# Patient Record
Sex: Female | Born: 1985 | Hispanic: No | Marital: Married | Smoking: Former smoker
Health system: Southern US, Community
[De-identification: ages and names within clinical notes are randomized; demographics above are authoritative.]

## PROBLEM LIST (undated history)

## (undated) DIAGNOSIS — E8881 Metabolic syndrome: Secondary | ICD-10-CM

## (undated) DIAGNOSIS — O139 Gestational [pregnancy-induced] hypertension without significant proteinuria, unspecified trimester: Secondary | ICD-10-CM

## (undated) DIAGNOSIS — N83209 Unspecified ovarian cyst, unspecified side: Secondary | ICD-10-CM

## (undated) DIAGNOSIS — E282 Polycystic ovarian syndrome: Secondary | ICD-10-CM

## (undated) DIAGNOSIS — Z789 Other specified health status: Secondary | ICD-10-CM

## (undated) DIAGNOSIS — D649 Anemia, unspecified: Secondary | ICD-10-CM

## (undated) HISTORY — PX: NO PAST SURGERIES: SHX2092

## (undated) HISTORY — DX: Gestational (pregnancy-induced) hypertension without significant proteinuria, unspecified trimester: O13.9

## (undated) HISTORY — DX: Other specified health status: Z78.9

---

## 2013-12-14 ENCOUNTER — Ambulatory Visit: Payer: BC Managed Care – PPO | Admitting: Medical

## 2017-02-19 ENCOUNTER — Ambulatory Visit (INDEPENDENT_AMBULATORY_CARE_PROVIDER_SITE_OTHER): Payer: 59 | Admitting: Obstetrics & Gynecology

## 2017-02-19 ENCOUNTER — Encounter: Payer: Self-pay | Admitting: Obstetrics & Gynecology

## 2017-02-19 VITALS — BP 123/79 | HR 79 | Ht 60.0 in | Wt 234.0 lb

## 2017-02-19 DIAGNOSIS — Z124 Encounter for screening for malignant neoplasm of cervix: Secondary | ICD-10-CM

## 2017-02-19 DIAGNOSIS — Z113 Encounter for screening for infections with a predominantly sexual mode of transmission: Secondary | ICD-10-CM | POA: Diagnosis not present

## 2017-02-19 DIAGNOSIS — Z01419 Encounter for gynecological examination (general) (routine) without abnormal findings: Secondary | ICD-10-CM

## 2017-02-19 DIAGNOSIS — N979 Female infertility, unspecified: Secondary | ICD-10-CM

## 2017-02-19 MED ORDER — PRENATAL VITAMINS 0.8 MG PO TABS: 1.0000 | ORAL_TABLET | Freq: Every day | ORAL | 12 refills | Status: AC

## 2017-02-19 NOTE — Patient Instructions (Signed)

## 2017-02-19 NOTE — Progress Notes (Signed)
Pt states she is trying to conceive x 6 months.  Pt is in homosexual relationship. Pt states she has tried IUI in the past, pt has had chemical pregnanacy. Pt states she has a small bump on vaginal area x 1 month.

## 2017-02-19 NOTE — Progress Notes (Signed)
Patient ID: Haley Banks, female   DOB: 1986/05/16, 31 y.o.   MRN: [id number]  Chief Complaint  Patient presents with  . Gynecologic Exam  Trying to conceive with semen donor  HPI Haley Banks is a 31 y.o. female.  G0P0000 Patient's last menstrual period was 02/06/2017. Regular menses. She has not conceived yet with either IUI or home methods with a donor. She is in a same sex relationship. Last pap she thinks was 2 years ago and was normal. HPI  History reviewed. No pertinent past medical history.  History reviewed. No pertinent surgical history.  Family History  Problem Relation Age of Onset  . Diabetes Paternal Grandfather     Social History Social History  Substance Use Topics  . Smoking status: Never Smoker  . Smokeless tobacco: Never Used  . Alcohol use No     Comment: occ    Not on File  No current outpatient prescriptions on file.   No current facility-administered medications for this visit.     Review of Systems Review of Systems  Constitutional: Negative.   Respiratory: Negative.   Gastrointestinal: Negative.   Genitourinary: Negative.   Neurological: Negative.     Blood pressure 123/79, pulse 79, height 5' (1.524 m), weight 234 lb (106.1 kg), last menstrual period 02/06/2017. Body mass index is 45.7 kg/m.  Physical Exam Physical Exam  Constitutional: She is oriented to person, place, and time. She appears well-developed. No distress.  obese  HENT:  Head: Normocephalic and atraumatic.  Eyes: Pupils are equal, round, and reactive to light.  Neck: Normal range of motion.  Cardiovascular: Normal rate.   Pulmonary/Chest: Effort normal. No respiratory distress.  Breasts: breasts appear normal, no suspicious masses, no skin or nipple changes or axillary nodes.   Abdominal: Soft. She exhibits no distension. There is no tenderness.  Genitourinary: Vagina normal and uterus normal. No vaginal discharge found.  Genitourinary Comments: No mass or  tenderness  Musculoskeletal: Normal range of motion.  Neurological: She is alert and oriented to person, place, and time.  Skin: Skin is warm and dry.  Psychiatric: She has a normal mood and affect. Her behavior is normal.  Vitals reviewed.   Data Reviewed Notes from Dr. Elesa Hackereaton in care everywhere  Assessment    Patient Active Problem List   Diagnosis Date Noted  . Infertility, female 02/19/2017  . Morbid obesity (HCC) 02/19/2017   Well woman exam    Plan    Will try to conceive without specialty care. She should notify us if she conceives. Take prenatal vitamin       Scheryl DarterJames Arnold 02/19/2017, 9:21 AM

## 2017-02-21 LAB — CYTOLOGY - PAP
CHLAMYDIA, DNA PROBE: NEGATIVE
DIAGNOSIS: NEGATIVE
Neisseria Gonorrhea: NEGATIVE

## 2019-06-24 LAB — OB RESULTS CONSOLE GC/CHLAMYDIA
Chlamydia: POSITIVE
Gonorrhea: NEGATIVE

## 2019-06-24 LAB — OB RESULTS CONSOLE ANTIBODY SCREEN: Antibody Screen: NEGATIVE

## 2019-06-24 LAB — OB RESULTS CONSOLE RPR: RPR: NONREACTIVE

## 2019-06-24 LAB — OB RESULTS CONSOLE RUBELLA ANTIBODY, IGM: Rubella: IMMUNE

## 2019-06-24 LAB — OB RESULTS CONSOLE HEPATITIS B SURFACE ANTIGEN: Hepatitis B Surface Ag: NEGATIVE

## 2019-06-24 LAB — OB RESULTS CONSOLE HIV ANTIBODY (ROUTINE TESTING): HIV: NONREACTIVE

## 2019-06-24 LAB — OB RESULTS CONSOLE ABO/RH: RH Type: POSITIVE

## 2019-07-20 LAB — OB RESULTS CONSOLE GC/CHLAMYDIA: Chlamydia: NEGATIVE

## 2019-08-06 NOTE — L&D Delivery Note (Signed)
Brief Delivery Note  Called to assist with shoulder dystocia on going for approximately 2 minutes. Mother found on hands and knees with fetal head delivered. Asked nurses to assist with maternal repositioning in lithotomy position. Woods scres maneuver was performed which allowed delivery of the posterior arm. Infant handed to neonatal team.   Please see rest of the delivery note by CNM   Haley Banks 01/09/2020, 1:00 PM

## 2019-08-06 NOTE — L&D Delivery Note (Signed)
Delivery Note Labor onset: 01/08/2020  Labor Onset Time:1348 Complete dilation at  01/09/20 1105 FHR second stage Cat 1 Analgesia/Anesthesia intrapartum: Epidural  Guided pushing with maternal urge. Delivery of a viable female at 9. Turtle sign noted approx 15 sec after delivery of fetal hea. Fetal head delivered in LOA position and did not restitute. Gentle axial traction applied w/o release of posterior shoulder. McRoberts' position at 30 sec, no release of shoulder. Called for assistance from [employer]. Suprapubic pressure from maternal right at 1 min, shoulder not released. Rotated to knee-chest and Dr. Jolayne Panther to bedside. Care handed to MD. See MD note for details. Nuchal cord: none. Infant with poor tone, cord double clamped and cut immediately. NICU present and received infant. Infant remained in room and transitioned from warmer to skin-to-skin. No deficit in left arm observed.  Cord blood sample collected Arterial cord blood sample attempted from cord, but artery collapsed. Collection attempted from site closer to placental base.  Placenta delivered Tomasa Blase, intact, with 3 VC.  Placenta to pathology. Uterine tone firm bleeding small  First degree perineal laceration identified.  Anesthesia: Epidural Repair: None, site hemostatic EBL (mL): 150 Complications: Shoulder dystocia 4 min 30 sec APGAR: APGAR (1 MIN):   APGAR (5 MINS):   APGAR (10 MINS):   Mom to postpartum.  Baby to Couplet care / Skin to Skin.  Roma Schanz MSN, CNM 01/09/2020, 1:21 PM

## 2019-08-20 ENCOUNTER — Other Ambulatory Visit (HOSPITAL_COMMUNITY): Payer: Self-pay | Admitting: Obstetrics & Gynecology

## 2019-08-20 DIAGNOSIS — Z3A20 20 weeks gestation of pregnancy: Secondary | ICD-10-CM

## 2019-08-20 DIAGNOSIS — Z363 Encounter for antenatal screening for malformations: Secondary | ICD-10-CM

## 2019-09-13 ENCOUNTER — Encounter (HOSPITAL_COMMUNITY): Payer: Self-pay | Admitting: *Deleted

## 2019-09-16 ENCOUNTER — Ambulatory Visit (HOSPITAL_COMMUNITY): Payer: 59 | Admitting: *Deleted

## 2019-09-16 ENCOUNTER — Encounter (HOSPITAL_COMMUNITY): Payer: Self-pay

## 2019-09-16 ENCOUNTER — Ambulatory Visit (HOSPITAL_COMMUNITY)
Admission: RE | Admit: 2019-09-16 | Discharge: 2019-09-16 | Disposition: A | Discharge status: Home / Self Care | Payer: 59 | Source: Ambulatory Visit | Attending: Obstetrics & Gynecology | Admitting: Obstetrics & Gynecology

## 2019-09-16 ENCOUNTER — Other Ambulatory Visit (HOSPITAL_COMMUNITY): Payer: Self-pay | Admitting: *Deleted

## 2019-09-16 ENCOUNTER — Other Ambulatory Visit: Payer: Self-pay

## 2019-09-16 VITALS — BP 126/81 | HR 89 | Temp 97.9°F | Wt 250.0 lb

## 2019-09-16 DIAGNOSIS — Z363 Encounter for antenatal screening for malformations: Secondary | ICD-10-CM | POA: Diagnosis present

## 2019-09-16 DIAGNOSIS — O0990 Supervision of high risk pregnancy, unspecified, unspecified trimester: Secondary | ICD-10-CM | POA: Insufficient documentation

## 2019-09-16 DIAGNOSIS — Z3A20 20 weeks gestation of pregnancy: Secondary | ICD-10-CM | POA: Insufficient documentation

## 2019-09-16 DIAGNOSIS — O99212 Obesity complicating pregnancy, second trimester: Secondary | ICD-10-CM | POA: Diagnosis not present

## 2019-09-16 DIAGNOSIS — Z362 Encounter for other antenatal screening follow-up: Secondary | ICD-10-CM

## 2019-09-16 NOTE — Progress Notes (Signed)
Pt reports small amount of clear watery fluid in her panties off and on this week. Reports +FM, denies pain or bleeding.

## 2019-09-28 ENCOUNTER — Encounter (HOSPITAL_COMMUNITY): Payer: Self-pay | Admitting: Obstetrics and Gynecology

## 2019-10-13 ENCOUNTER — Encounter (HOSPITAL_COMMUNITY): Payer: Self-pay

## 2019-10-13 ENCOUNTER — Ambulatory Visit (HOSPITAL_COMMUNITY)
Admission: RE | Admit: 2019-10-13 | Discharge: 2019-10-13 | Disposition: A | Discharge status: Home / Self Care | Payer: 59 | Source: Ambulatory Visit | Attending: Obstetrics and Gynecology | Admitting: Obstetrics and Gynecology

## 2019-10-13 ENCOUNTER — Other Ambulatory Visit: Payer: Self-pay

## 2019-10-13 ENCOUNTER — Ambulatory Visit (HOSPITAL_COMMUNITY): Payer: 59 | Admitting: *Deleted

## 2019-10-13 VITALS — BP 124/81 | HR 96 | Temp 97.5°F

## 2019-10-13 DIAGNOSIS — Z315 Encounter for genetic counseling: Secondary | ICD-10-CM

## 2019-10-13 DIAGNOSIS — Z362 Encounter for other antenatal screening follow-up: Secondary | ICD-10-CM

## 2019-10-13 DIAGNOSIS — O99212 Obesity complicating pregnancy, second trimester: Secondary | ICD-10-CM

## 2019-10-13 DIAGNOSIS — Z363 Encounter for antenatal screening for malformations: Secondary | ICD-10-CM

## 2019-10-13 DIAGNOSIS — Z3A24 24 weeks gestation of pregnancy: Secondary | ICD-10-CM

## 2019-12-29 ENCOUNTER — Other Ambulatory Visit: Payer: Self-pay | Admitting: Obstetrics & Gynecology

## 2019-12-30 ENCOUNTER — Observation Stay (HOSPITAL_BASED_OUTPATIENT_CLINIC_OR_DEPARTMENT_OTHER): Payer: 59

## 2019-12-30 ENCOUNTER — Observation Stay (HOSPITAL_COMMUNITY)
Admission: AD | Admit: 2019-12-30 | Discharge: 2019-12-31 | Disposition: A | Discharge status: Home / Self Care | Payer: 59 | Attending: Obstetrics & Gynecology | Admitting: Obstetrics & Gynecology

## 2019-12-30 ENCOUNTER — Other Ambulatory Visit: Payer: Self-pay

## 2019-12-30 ENCOUNTER — Encounter (HOSPITAL_COMMUNITY): Payer: Self-pay | Admitting: Obstetrics and Gynecology

## 2019-12-30 DIAGNOSIS — Z3A36 36 weeks gestation of pregnancy: Secondary | ICD-10-CM | POA: Insufficient documentation

## 2019-12-30 DIAGNOSIS — Z20822 Contact with and (suspected) exposure to covid-19: Secondary | ICD-10-CM | POA: Insufficient documentation

## 2019-12-30 DIAGNOSIS — O139 Gestational [pregnancy-induced] hypertension without significant proteinuria, unspecified trimester: Principal | ICD-10-CM | POA: Diagnosis present

## 2019-12-30 DIAGNOSIS — Z362 Encounter for other antenatal screening follow-up: Secondary | ICD-10-CM

## 2019-12-30 DIAGNOSIS — Z3A35 35 weeks gestation of pregnancy: Secondary | ICD-10-CM

## 2019-12-30 DIAGNOSIS — R030 Elevated blood-pressure reading, without diagnosis of hypertension: Secondary | ICD-10-CM | POA: Diagnosis not present

## 2019-12-30 DIAGNOSIS — Z3689 Encounter for other specified antenatal screening: Secondary | ICD-10-CM

## 2019-12-30 DIAGNOSIS — O26893 Other specified pregnancy related conditions, third trimester: Secondary | ICD-10-CM

## 2019-12-30 HISTORY — DX: Anemia, unspecified: D64.9

## 2019-12-30 HISTORY — DX: Polycystic ovarian syndrome: E28.2

## 2019-12-30 HISTORY — DX: Unspecified ovarian cyst, unspecified side: N83.209

## 2019-12-30 HISTORY — DX: Metabolic syndrome: E88.81

## 2019-12-30 LAB — PROTEIN / CREATININE RATIO, URINE
Creatinine, Urine: 33.9 mg/dL
Protein Creatinine Ratio: 0.24 mg/mg{Cre} — ABNORMAL HIGH (ref 0.00–0.15)
Total Protein, Urine: 8 mg/dL

## 2019-12-30 LAB — URINALYSIS, COMPLETE (UACMP) WITH MICROSCOPIC
Bacteria, UA: NONE SEEN
Bilirubin Urine: NEGATIVE
Glucose, UA: >=500 mg/dL — AB
Ketones, ur: 5 mg/dL — AB
Nitrite: NEGATIVE
Protein, ur: NEGATIVE mg/dL
Specific Gravity, Urine: 1.012 (ref 1.005–1.030)
pH: 7 (ref 5.0–8.0)

## 2019-12-30 LAB — CBC
HCT: 40.3 % (ref 36.0–46.0)
Hemoglobin: 13.6 g/dL (ref 12.0–15.0)
MCH: 29.2 pg (ref 26.0–34.0)
MCHC: 33.7 g/dL (ref 30.0–36.0)
MCV: 86.7 fL (ref 80.0–100.0)
Platelets: 205 10*3/uL (ref 150–400)
RBC: 4.65 MIL/uL (ref 3.87–5.11)
RDW: 13.1 % (ref 11.5–15.5)
WBC: 11.7 10*3/uL — ABNORMAL HIGH (ref 4.0–10.5)
nRBC: 0 % (ref 0.0–0.2)

## 2019-12-30 LAB — COMPREHENSIVE METABOLIC PANEL
ALT: 29 U/L (ref 0–44)
AST: 19 U/L (ref 15–41)
Albumin: 2.7 g/dL — ABNORMAL LOW (ref 3.5–5.0)
Alkaline Phosphatase: 97 U/L (ref 38–126)
Anion gap: 9 (ref 5–15)
BUN: 5 mg/dL — ABNORMAL LOW (ref 6–20)
CO2: 23 mmol/L (ref 22–32)
Calcium: 9.4 mg/dL (ref 8.9–10.3)
Chloride: 106 mmol/L (ref 98–111)
Creatinine, Ser: 0.49 mg/dL (ref 0.44–1.00)
GFR calc Af Amer: >60 mL/min (ref >60)
GFR calc non Af Amer: >60 mL/min (ref >60)
Glucose, Bld: 123 mg/dL — ABNORMAL HIGH (ref 70–99)
Potassium: 3.9 mmol/L (ref 3.5–5.1)
Sodium: 138 mmol/L (ref 135–145)
Total Bilirubin: 0.1 mg/dL — ABNORMAL LOW (ref 0.3–1.2)
Total Protein: 6.4 g/dL — ABNORMAL LOW (ref 6.5–8.1)

## 2019-12-30 LAB — GROUP B STREP BY PCR: Group B strep by PCR: NEGATIVE

## 2019-12-30 LAB — TYPE AND SCREEN
ABO/RH(D): A POS
Antibody Screen: NEGATIVE

## 2019-12-30 LAB — SARS CORONAVIRUS 2 (TAT 6-24 HRS): SARS Coronavirus 2: NEGATIVE

## 2019-12-30 LAB — ABO/RH: ABO/RH(D): A POS

## 2019-12-30 MED ORDER — LABETALOL HCL 5 MG/ML IV SOLN: 40.0000 mg | INTRAVENOUS | Status: DC | PRN

## 2019-12-30 MED ORDER — BETAMETHASONE SOD PHOS & ACET 6 (3-3) MG/ML IJ SUSP
12.0000 mg | INTRAMUSCULAR | Status: AC
  Administered 2019-12-30 – 2019-12-31 (×2): 12 mg via INTRAMUSCULAR
  Filled 2019-12-30: qty 5

## 2019-12-30 MED ORDER — PRENATAL MULTIVITAMIN CH
1.0000 | ORAL_TABLET | Freq: Every day | ORAL | Status: DC
  Filled 2019-12-30: qty 1

## 2019-12-30 MED ORDER — DOCUSATE SODIUM 100 MG PO CAPS
100.0000 mg | ORAL_CAPSULE | Freq: Every day | ORAL | Status: DC
  Filled 2019-12-30: qty 1

## 2019-12-30 MED ORDER — ZOLPIDEM TARTRATE 5 MG PO TABS: 5.0000 mg | ORAL_TABLET | Freq: Every evening | ORAL | Status: DC | PRN

## 2019-12-30 MED ORDER — LABETALOL HCL 5 MG/ML IV SOLN: 20.0000 mg | INTRAVENOUS | Status: DC | PRN

## 2019-12-30 MED ORDER — LABETALOL HCL 5 MG/ML IV SOLN: 80.0000 mg | INTRAVENOUS | Status: DC | PRN

## 2019-12-30 MED ORDER — HYDRALAZINE HCL 20 MG/ML IJ SOLN: 10.0000 mg | INTRAMUSCULAR | Status: DC | PRN

## 2019-12-30 MED ORDER — SODIUM CHLORIDE 0.9 % IV SOLN: 250.0000 mL | INTRAVENOUS | Status: DC | PRN

## 2019-12-30 MED ORDER — SODIUM CHLORIDE 0.9% FLUSH: 3.0000 mL | INTRAVENOUS | Status: DC | PRN

## 2019-12-30 MED ORDER — LACTATED RINGERS IV SOLN
INTRAVENOUS | Status: DC
  Administered 2019-12-30: 125 mL/h via INTRAVENOUS

## 2019-12-30 MED ORDER — ACETAMINOPHEN 325 MG PO TABS: 650.0000 mg | ORAL_TABLET | ORAL | Status: DC | PRN

## 2019-12-30 MED ORDER — CALCIUM CARBONATE ANTACID 500 MG PO CHEW: 2.0000 | CHEWABLE_TABLET | ORAL | Status: DC | PRN

## 2019-12-30 MED ORDER — SODIUM CHLORIDE 0.9% FLUSH: 3.0000 mL | Freq: Two times a day (BID) | INTRAVENOUS | Status: DC

## 2019-12-30 NOTE — H&P (Signed)
OB ADMISSION/ HISTORY & PHYSICAL:  Admission Date: 12/30/2019 11:12 AM  Admit Diagnosis: Gestational hypertension [O13.9]    Haley Banks is a 34 y.o. female presenting for elevated blood pressures at the office. Pt states she was seen today at [facility] and stated she had a headache and had elevated BP. Pt was sent to MAU for a preeclampsia work-up. CBC and CMP was WNL with a PCR 0.24. Pt states her headache has resolved. Pt had one sever range BP and several elevated BPs and she was admitted for observation. Denies epigastric pain or visual changes. Denies vaginal bleeding or leaking of fluid. States she feels period-like cramping intermittently. Endorses + fetal movement. Partner at the bedside providing support. Anticipating a baby boy.   Prenatal History: G1P0000   EDC : 01/28/2020, by Last Menstrual Period  Prenatal care at [facility] since 13 weeks   Prenatal course complicated by: 1. Chlamydial infection - Positive at MP visit, treated. TOC negative, recheck at 36 weeks. 2. Maternal obesity complicating pregnancy, childbirth and the puerperium, antepartum - Follow growth, antenatal testing from 32 weeks, delivery by 40 weeks. MFM recommend efw q4wks until delivery 3. History of infertility - female  4. Polycystic ovary syndrome  Prenatal Labs: ABO, Rh:   A POS Antibody:  Neg Rubella:   Immune RPR:   Non-reactive HBsAg:   Negative HIV:   Negative GBS:   ??? 1 hr Glucola : 183, 3 hr GTT WNL Genetic Screening: Low risk female Ultrasound: 12/16/2019 Nelda [place]se[place]. Vertex. Posterior Placenta. Cervix not seen per Protocol. AMniotic fluid appears Normal AFI=16.8 cm BPP after NST for fetal Tachycardia BPP 8/8 in 5 minutes Bilateral Adnexas appear unremarkable    Maternal Diabetes: No Genetic Screening: Normal Maternal Ultrasounds/Referrals: Normal Fetal Ultrasounds or other Referrals:  None Maternal Substance Abuse:  No Significant Maternal Medications:  None Significant Maternal Lab Results:   Other: Chlamydia + at NOB, treated Other Comments:  None  Medical / Surgical History :  Past medical history:  Past Medical History:  Diagnosis Date  . Anemia   . Insulin resistance    linked w/PCOS  . Medical history non-contributory   . Ovarian cyst   . PCOS (polycystic ovarian syndrome)      Past surgical history:  Past Surgical History:  Procedure Laterality Date  . NO PAST SURGERIES       Family History:  Family History  Problem Relation Age of Onset  . Diabetes Paternal Grandfather   . Asthma Mother   . Alopecia Mother   . Diabetes Father   . Hypertension Father      Social History:  reports that she has been smoking e-cigarettes. She has never used smokeless tobacco. She reports previous alcohol use. She reports that she does not use drugs.   Allergies: Patient has no known allergies.   Current Medications at time of admission:  Medications Prior to Admission  Medication Sig Dispense Refill Last Dose  . Fluticasone Propionate (FLONASE NA) Place into the nose.   12/30/2019 at Unknown time  . Levocetirizine Dihydrochloride (XYZAL PO) Take by mouth.   12/30/2019 at Unknown time  . Prenatal Multivit-Min-Fe-FA (PRENATAL VITAMINS) 0.8 MG tablet Take 1 tablet by mouth daily. 30 tablet 12 12/29/2019 at Unknown time  . Pyridoxine HCl (VITAMIN B6 PO) Take by mouth.   12/30/2019 at Unknown time    Review of Systems: Review of Systems  Constitutional: Negative for chills and fever.  HENT: Negative for congestion and sore throat.   Eyes:  Negative for blurred vision and photophobia.  Respiratory: Negative for cough and shortness of breath.   Cardiovascular: Negative for chest pain and leg swelling.  Gastrointestinal: Negative for heartburn, nausea and vomiting.  Musculoskeletal: Negative for back pain.  Neurological: Positive for headaches. Negative for dizziness.  Psychiatric/Behavioral: Negative for depression. The patient is not nervous/anxious.    Physical  Exam: Vital signs and nursing notes reviewed.  ED Triage Vitals  Enc Vitals Group     BP 12/30/19 1130 (!) 155/110     Pulse Rate 12/30/19 1130 (!) 116     Resp 12/30/19 1130 18     Temp 12/30/19 1130 99.2 F (37.3 C)     Temp Source 12/30/19 1130 Oral     SpO2 12/30/19 1130 99 %     Weight 12/30/19 1130 268 lb 9.6 oz (121.8 kg)     Height 12/30/19 1130 5' (1.524 m)     Head Circumference --      Peak Flow --      Pain Score 12/30/19 1128 5     Pain Loc --      Pain Edu? --      Excl. in GC? --     General: AAO x 3, NAD Heart: RRR Lungs:CTAB Abdomen: Gravid, NT Extremities: no edema Genitalia / VE: Dilation: Closed Exam by:: N. Nugent, NP   FHR: 140BPM, mod variability, + accels, no decels TOCO: Ctx q 2 minutes  Labs:   Pending T&S, CBC, RPR  Recent Labs    12/30/19 1150  WBC 11.7*  HGB 13.6  HCT 40.3  PLT 205   Assessment:  34 y.o. G1P0000 at [redacted]w[redacted]d here for gestational hypertension  1. Gestational hypertension; diagnosed today 2. Category 1 FHT  Plan:  1. Admit to antepartum 2. Continuous FHR monitoring and Toco 3. BMZ series 4. Regular diet 5. Repeat labs in AM 6. Consult MFM for U/S and recommendations 7. Send urine for culture 8. GBS culture  Dr. Normand Sloop notified of admission / plan of care  June Leap CNM, MSN 12/30/2019, 5:39 PM

## 2019-12-30 NOTE — MAU Note (Addendum)
Went in to [employer] for NST, was contracting and BP was elevated, this was a new problem.  +HA (hasn't taken anything), ? 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

## 2019-12-30 NOTE — MAU Provider Note (Signed)
History     CSN: [id number]  Arrival date and time: 12/30/19 1112   First Provider Initiated Contact with Patient 12/30/19 1424      Chief Complaint  Patient presents with  . Headache  . Hypertension  . Contractions   [name] is a 34 y.o. G1P0000 at [redacted]w[redacted]d who presents to MAU for preeclampsia evaluation after she was seen in the office today with elevated blood pressure and routine NST with contractions.  Pt denies HA, blurry vision/seeing spots, N/V, epigastric pain, swelling in face and hands, sudden weight gain. Pt denies chest pain and SOB.  Pt denies constipation, diarrhea, or urinary problems. Pt denies fever, chills, fatigue, sweating or changes in appetite. Pt denies dizziness, light-headedness, weakness.  Pt denies VB, ctx, LOF and reports good FM.   OB History    Gravida  1   Para  0   Term  0   Preterm  0   AB  0   Living  0     SAB  0   TAB  0   Ectopic  0   Multiple  0   Live Births  0           Past Medical History:  Diagnosis Date  . Anemia   . Insulin resistance    linked w/PCOS  . Medical history non-contributory   . Ovarian cyst   . PCOS (polycystic ovarian syndrome)     Past Surgical History:  Procedure Laterality Date  . NO PAST SURGERIES      Family History  Problem Relation Age of Onset  . Diabetes Paternal Grandfather   . Asthma Mother   . Alopecia Mother   . Diabetes Father   . Hypertension Father     Social History   Tobacco Use  . Smoking status: Current Every Day Smoker    Types: E-cigarettes  . Smokeless tobacco: Never Used  . Tobacco comment: in HS  Substance Use Topics  . Alcohol use: Not Currently    Comment: occ wine  . Drug use: No    Allergies: No Known Allergies  Medications Prior to Admission  Medication Sig Dispense Refill Last Dose  . Fluticasone Propionate (FLONASE NA) Place into the nose.   12/30/2019 at Unknown time  . Levocetirizine Dihydrochloride (XYZAL PO) Take by  mouth.   12/30/2019 at Unknown time  . Prenatal Multivit-Min-Fe-FA (PRENATAL VITAMINS) 0.8 MG tablet Take 1 tablet by mouth daily. 30 tablet 12 12/29/2019 at Unknown time  . Pyridoxine HCl (VITAMIN B6 PO) Take by mouth.   12/30/2019 at Unknown time    Review of Systems  Constitutional: Negative for chills, diaphoresis, fatigue and fever.  Eyes: Negative for visual disturbance.  Respiratory: Negative for shortness of breath.   Cardiovascular: Negative for chest pain.  Gastrointestinal: Negative for abdominal pain, constipation, diarrhea, nausea and vomiting.  Genitourinary: Negative for dysuria, flank pain, frequency, pelvic pain, urgency, vaginal bleeding and vaginal discharge.  Neurological: Negative for dizziness, weakness, light-headedness and headaches.   Physical Exam   Blood pressure (!) 142/79, pulse (!) 105, temperature 98.7 F (37.1 C), temperature source Oral, resp. rate 18, height 5' (1.524 m), weight 121.8 kg, last menstrual period 04/23/2019, SpO2 98 %.  Patient Vitals for the past 24 hrs:  BP Temp Temp src Pulse Resp SpO2 Height Weight  12/30/19 1656 (!) 142/79 98.7 F (37.1 C) Oral (!) 105 18 98 % - -  12/30/19 1430 134/88 - - (!) 108 - - - -  12/30/19 1415 134/86 - - 100 - - - -  12/30/19 1400 140/87 - - (!) 111 - - - -  12/30/19 1346 133/89 - - 99 - - - -  12/30/19 1331 128/90 - - (!) 102 - - - -  12/30/19 1320 - - - - - 99 % - -  12/30/19 1316 (!) 141/89 - - 92 - - - -  12/30/19 1315 - - - - - 98 % - -  12/30/19 1310 - - - - - 98 % - -  12/30/19 1305 - - - - - 97 % - -  12/30/19 1300 135/88 - - 92 - 99 % - -  12/30/19 1255 - - - - - 98 % - -  12/30/19 1250 - - - - - 98 % - -  12/30/19 1245 131/86 - - 98 - 98 % - -  12/30/19 1239 - - - - - 98 % - -  12/30/19 1235 - - - - - 98 % - -  12/30/19 1231 138/89 - - 97 - - - -  12/30/19 1230 - - - - - 98 % - -  12/30/19 1226 - - - - - 98 % - -  12/30/19 1220 - - - - - 98 % - -  12/30/19 1216 (!) 128/92 - - 100 - - - -   12/30/19 1215 (!) 129/95 - - - - 98 % - -  12/30/19 1210 132/90 - - - - 98 % - -  12/30/19 1208 134/90 - - (!) 103 - - - -  12/30/19 1205 - - - - - 98 % - -  12/30/19 1200 - - - - - 98 % - -  12/30/19 1130 (!) 155/110 99.2 F (37.3 C) Oral (!) 116 18 99 % 5' (1.524 m) 121.8 kg   Physical Exam  Constitutional: She is oriented to person, place, and time. She appears well-developed and well-nourished. No distress.  HENT:  Head: Normocephalic and atraumatic.  Respiratory: Effort normal.  Neurological: She is alert and oriented to person, place, and time.  Skin: She is not diaphoretic.  Psychiatric: She has a normal mood and affect. Her behavior is normal. Judgment and thought content normal.   Results for orders placed or performed during the hospital encounter of 12/30/19 (from the past 24 hour(s))  Protein / creatinine ratio, urine     Status: Abnormal   Collection Time: 12/30/19 11:48 AM  Result Value Ref Range   Creatinine, Urine 33.90 mg/dL   Total Protein, Urine 8 mg/dL   Protein Creatinine Ratio 0.24 (H) 0.00 - 0.15 mg/mg[Cre]  CBC     Status: Abnormal   Collection Time: 12/30/19 11:50 AM  Result Value Ref Range   WBC 11.7 (H) 4.0 - 10.5 K/uL   RBC 4.65 3.87 - 5.11 MIL/uL   Hemoglobin 13.6 12.0 - 15.0 g/dL   HCT 62.2 29.7 - 98.9 %   MCV 86.7 80.0 - 100.0 fL   MCH 29.2 26.0 - 34.0 pg   MCHC 33.7 30.0 - 36.0 g/dL   RDW 21.1 94.1 - 74.0 %   Platelets 205 150 - 400 K/uL   nRBC 0.0 0.0 - 0.2 %  Comprehensive metabolic panel     Status: Abnormal   Collection Time: 12/30/19 11:50 AM  Result Value Ref Range   Sodium 138 135 - 145 mmol/L   Potassium 3.9 3.5 - 5.1 mmol/L   Chloride 106 98 - 111 mmol/L  CO2 23 22 - 32 mmol/L   Glucose, Bld 123 (H) 70 - 99 mg/dL   BUN 5 (L) 6 - 20 mg/dL   Creatinine, Ser 8.33 0.44 - 1.00 mg/dL   Calcium 9.4 8.9 - 82.5 mg/dL   Total Protein 6.4 (L) 6.5 - 8.1 g/dL   Albumin 2.7 (L) 3.5 - 5.0 g/dL   AST 19 15 - 41 U/L   ALT 29 0 - 44 U/L    Alkaline Phosphatase 97 38 - 126 U/L   Total Bilirubin 0.1 (L) 0.3 - 1.2 mg/dL   GFR calc non Af Amer >60 >60 mL/min   GFR calc Af Amer >60 >60 mL/min   Anion gap 9 5 - 15    MAU Course  Procedures  MDM -preeclampsia evaluation with one severe range BP in MAU on admission -28% elevated BPs -symptoms include: none -CBC: H/H 13.6/40.3, platelets 205 -CMP: serum creatinine 0.49, AST/ALT 19/29 -PCr: 0.24 -EFM: reactive, followed by non-reactive       -baseline: 150/140       -variability: minimal/moderate       -accels: present, 15x15       -decels: absent       -TOCO: ctx q2-59min -per consultation with Dr. Earlene Plater, give LR 168mL/hr -consulted with Dr. Earlene Plater, pt should be admitted for overnight observation for serial BPs, elevated PCr with repeat labs in AM, ctx and now non-reactive NST -called and spoke with Dr. Normand Sloop to recommend admission, Dr. Normand Sloop agrees with plan -admit to [facility] for observation  Orders Placed This Encounter  Procedures  . SARS CORONAVIRUS 2 (TAT 6-24 HRS) Nasopharyngeal Nasopharyngeal Swab    Standing Status:   Standing    Number of Occurrences:   1    Order Specific Question:   Is this test for diagnosis or screening    Answer:   Screening    Order Specific Question:   Symptomatic for COVID-19 as defined by CDC    Answer:   No    Order Specific Question:   Hospitalized for COVID-19    Answer:   No    Order Specific Question:   Admitted to ICU for COVID-19    Answer:   No    Order Specific Question:   Previously tested for COVID-19    Answer:   No    Order Specific Question:   Resident in a congregate (group) care setting    Answer:   No    Order Specific Question:   Employed in healthcare setting    Answer:   No    Order Specific Question:   Pregnant    Answer:   Yes    Order Specific Question:   Has patient completed COVID vaccination(s) (2 doses of Pfizer/Moderna 1 dose of [employer])    Answer:   No    Order Specific  Question:   Pre-procedural testing    Answer:   Yes  . CBC    Standing Status:   Standing    Number of Occurrences:   1  . Comprehensive metabolic panel    Standing Status:   Standing    Number of Occurrences:   1  . Protein / creatinine ratio, urine    Standing Status:   Standing    Number of Occurrences:   1  . Notify Physician    Confirmatory reading of BP> 160/110 15 minutes later    Standing Status:   Standing    Number of  Occurrences:   1    Order Specific Question:   Notify Physician    Answer:   Temp greater than or equal to 100.4    Order Specific Question:   Notify Physician    Answer:   RR greater than 24 or less than 10    Order Specific Question:   Notify Physician    Answer:   HR greater than 120 or less than 50    Order Specific Question:   Notify Physician    Answer:   SBP greater than 160 mmHG or less than 80 mmHG    Order Specific Question:   Notify Physician    Answer:   DBP greater than 110 mmHG or less than 45 mmHG    Order Specific Question:   Notify Physician    Answer:   Urinary output is less than 158ml for any 4 hour period  . Measure blood pressure    20 minutes after giving hydralazine 10 MG IV dose.  Call MD if SBP >/= 160 or DBP >/= 110.    Standing Status:   Standing    Number of Occurrences:   1  . Insert peripheral IV    Standing Status:   Standing    Number of Occurrences:   1   Meds ordered this encounter  Medications  . AND Linked Order Group   . labetalol (NORMODYNE) injection 20 mg   . labetalol (NORMODYNE) injection 40 mg   . labetalol (NORMODYNE) injection 80 mg   . hydrALAZINE (APRESOLINE) injection 10 mg  . lactated ringers infusion    Assessment and Plan   1. Elevated blood pressure reading without diagnosis of hypertension   2. [redacted] weeks gestation of pregnancy   3. NST (non-stress test) reactive    -admit to [facility] for observation  Gerrie Nordmann Nugent 12/30/2019, 4:57 PM

## 2019-12-31 ENCOUNTER — Ambulatory Visit: Payer: 59 | Attending: Obstetrics

## 2019-12-31 DIAGNOSIS — Z3A36 36 weeks gestation of pregnancy: Secondary | ICD-10-CM

## 2019-12-31 DIAGNOSIS — O133 Gestational [pregnancy-induced] hypertension without significant proteinuria, third trimester: Secondary | ICD-10-CM | POA: Diagnosis not present

## 2019-12-31 LAB — CBC
HCT: 37.7 % (ref 36.0–46.0)
Hemoglobin: 12.5 g/dL (ref 12.0–15.0)
MCH: 29.1 pg (ref 26.0–34.0)
MCHC: 33.2 g/dL (ref 30.0–36.0)
MCV: 87.9 fL (ref 80.0–100.0)
Platelets: 207 10*3/uL (ref 150–400)
RBC: 4.29 MIL/uL (ref 3.87–5.11)
RDW: 13 % (ref 11.5–15.5)
WBC: 12.6 10*3/uL — ABNORMAL HIGH (ref 4.0–10.5)
nRBC: 0 % (ref 0.0–0.2)

## 2019-12-31 LAB — COMPREHENSIVE METABOLIC PANEL
ALT: 29 U/L (ref 0–44)
AST: 21 U/L (ref 15–41)
Albumin: 2.5 g/dL — ABNORMAL LOW (ref 3.5–5.0)
Alkaline Phosphatase: 95 U/L (ref 38–126)
Anion gap: 10 (ref 5–15)
BUN: 6 mg/dL (ref 6–20)
CO2: 20 mmol/L — ABNORMAL LOW (ref 22–32)
Calcium: 9.2 mg/dL (ref 8.9–10.3)
Chloride: 105 mmol/L (ref 98–111)
Creatinine, Ser: 0.44 mg/dL (ref 0.44–1.00)
GFR calc Af Amer: >60 mL/min (ref >60)
GFR calc non Af Amer: >60 mL/min (ref >60)
Glucose, Bld: 141 mg/dL — ABNORMAL HIGH (ref 70–99)
Potassium: 4.2 mmol/L (ref 3.5–5.1)
Sodium: 135 mmol/L (ref 135–145)
Total Bilirubin: 0.3 mg/dL (ref 0.3–1.2)
Total Protein: 5.7 g/dL — ABNORMAL LOW (ref 6.5–8.1)

## 2019-12-31 NOTE — Plan of Care (Signed)
  Problem: Clinical Measurements: Goal: Ability to maintain clinical measurements within normal limits will improve Outcome: Adequate for Discharge Goal: Will remain free from infection Outcome: Adequate for Discharge Goal: Diagnostic test results will improve Outcome: Adequate for Discharge Goal: Respiratory complications will improve Outcome: Adequate for Discharge Goal: Cardiovascular complication will be avoided Outcome: Adequate for Discharge   Problem: Activity: Goal: Risk for activity intolerance will decrease Outcome: Adequate for Discharge   Problem: Coping: Goal: Level of anxiety will decrease Outcome: Adequate for Discharge

## 2019-12-31 NOTE — Progress Notes (Addendum)
Subjective:    Doing well. Reports mild HA this AM relieved w/ Tylenol. Denies visual changes and epigastric pain. Questions answered.   Objective:    VS: BP 137/80 (BP Location: Right Arm)   Pulse 99   Temp 98.5 F (36.9 C) (Oral)   Resp 18   Ht 5' (1.524 m)   Wt 121.8 kg   LMP 04/23/2019   SpO2 98%   BMI 52.46 kg/m   CBC    Component Value Date/Time   WBC 12.6 (H) 12/31/2019 0609   RBC 4.29 12/31/2019 0609   HGB 12.5 12/31/2019 0609   HCT 37.7 12/31/2019 0609   PLT 207 12/31/2019 0609   MCV 87.9 12/31/2019 0609   MCH 29.1 12/31/2019 0609   MCHC 33.2 12/31/2019 0609   RDW 13.0 12/31/2019 0609   chondromalacia patella  FHR : baseline 140 / variability moderate / accelerations present / variable decelerations Toco: periods of irreg irritable uterine activity Membranes: intact Dilation: Closed Exam by:: N. Nugent, NP  Phys Exam: Gen: AAO x3, cooperative CV: RRR, no murmurs Resp: clear, unlabored GI: abd soft, gravid, BS x4  Extremities: 1+ non-pitting edema, neg for pain, cords, or tenderness Neuro: 2+ DTRs, neg clonus   Assessment/Plan:   34 y.o. G1P0000 [redacted]w[redacted]d GHTN    -AM labs normal    -normal to mid-range BP Cat 1 GBS neg MFM consult today and U/S BMZ #2 @ 1816 Urine culture pending    Roma Schanz CNM, MSN 12/31/2019 10:27 AM

## 2019-12-31 NOTE — Consult Note (Signed)
MFM Note  Haley Banks is a 34 year old gravida 1 para 0 currently at 36 weeks.  She was seen in consultation today due to elevated blood pressures most likely due to gestational hypertension.  The patient was seen in the office yesterday for a nonstress test when she was noted to have elevated blood pressures along with a headache.  Due to her elevated blood pressures, she was sent to the MAU for further evaluation.  In the MAU, she was noted to have one severe range blood pressure of 155/110.  As a result, she was admitted for observation.  She is receiving a course of antenatal corticosteroids.  Her PIH labs were all within normal limits.  Her protein/creatinine ratio was 0.24 indicating that she does not have significant proteinuria.  The patient had an ultrasound performed last evening which showed an EFW of 7 pounds 15 ounces (>99%).  The AFI was 12.62 cm. She denies any history of chronic hypertension.    Currently, the patient reports that her headache has resolved.  Her blood pressures overnight were in the 120s to 150s over 80s to 90s range.  Her NST is reactive this morning. This is the patient's first pregnancy.  She reports a history of infertility.  This pregnancy was conceived using intrauterine insemination.  The patient denies any significant past medical history and denies any past surgical history.  She reports that she has screened negative for gestational diabetes in her current pregnancy. The implications and management of gestational hypertension was discussed in detail today with the patient and her partner.  They were advised that gestational hypertension may often progress to preeclampsia.  However, it does not appear that she has preeclampsia right now based on the negative protein creatinine ratio and her normal PIH labs.  Due to gestational hypertension at 36 weeks, I would recommend that the patient receive a complete course of antenatal corticosteroids.  Should her blood  pressures remain in the normal range, should she remain asymptomatic, and her fetal status remains reassuring, outpatient management may be considered once she receives the second dose of betamethasone.    Should the patient be discharged home, I would recommend that she be seen for twice weekly fetal testing and blood pressure checks in your office.  Due to gestational hypertension, delivery should occur at around 37 weeks.  An earlier delivery may be indicated should her blood pressures persistently be in the 160s over 90s to 100s range, should she complain of any signs or symptoms of severe preeclampsia, or for nonreassuring fetal status. The patient was also advised to obtain a blood pressure cuff and to monitor her blood pressures daily next week.  She understands that she should call should her blood pressures remain persistently above 140s over 90s.  The patient and her partner were both reassured that babies delivered at around 37 weeks generally will have a good outcome.  At the end of the consultation, the patient and her partner stated that all their questions have been answered to their complete satisfaction.    Thank you for referring this patient for a Maternal-Fetal Medicine consultation.  Recommendations:  Complete course of antenatal corticosteroids  Outpatient management with twice weekly fetal testing if the patient's blood pressures remain stable and she is asymptomatic  Delivery at 37 weeks (next week)  Delivery prior to 37 weeks would be indicated should her blood pressures be persistently greater than 160 over 90s, should she complain of any signs or symptoms of severe preeclampsia, or  for nonreassuring fetal status

## 2020-01-02 NOTE — Discharge Summary (Signed)
OB Discharge Summary  Patient Name: Haley Banks DOB: 1985/11/17 MRN: [id number]  Date of admission: 12/30/2019 Intrauterine pregnancy: [redacted]w[redacted]d  Admitting diagnosis: Gestational hypertension [O13.9] Secondary diagnosis: Gestational Hypertension  Date of discharge: 12/31/2019    Discharge diagnosis: GHTN, preterm pregnancy undelivered     Prenatal history: G1P0000   EDC : 01/28/2020, by Last Menstrual Period  Prenatal care at [facility] A[facility], Rh: --/--/A POS, A POS Performed at [facility] [id number], [place] [address] [phone]/27 1750) / Rhophylac N/A Antibody: NEG (05/27 1750) Rubella:  Imm  / MMR booster N/A RPR:   NR HBsAg:   Neg HIV:   NR GBS: NEGATIVE/-- (05/27 1820)                                    Hospital course:  Admitted for preeclampsia work up and antenatal steroids.    Labs: Lab Results  Component Value Date   WBC 12.6 (H) 12/31/2019   HGB 12.5 12/31/2019   HCT 37.7 12/31/2019   MCV 87.9 12/31/2019   PLT 207 12/31/2019   CMP Latest Ref Rng & Units 12/31/2019  Glucose 70 - 99 mg/dL 141(H)  BUN 6 - 20 mg/dL 6  Creatinine 0.44 - 1.00 mg/dL 0.44  Sodium 135 - 145 mmol/L 135  Potassium 3.5 - 5.1 mmol/L 4.2  Chloride 98 - 111 mmol/L 105  CO2 22 - 32 mmol/L 20(L)  Calcium 8.9 - 10.3 mg/dL 9.2  Total Protein 6.5 - 8.1 g/dL 5.7(L)  Total Bilirubin 0.3 - 1.2 mg/dL 0.3  Alkaline Phos 38 - 126 U/L 95  AST 15 - 41 U/L 21  ALT 0 - 44 U/L 29    Physical Exam @ time of discharge:  Vitals:   12/31/19 0807 12/31/19 1240 12/31/19 1631 12/31/19 2035  BP: 137/80 (!) 141/67 (!) 117/56 139/86  Pulse: 99 (!) 116 93 (!) 106  Resp: '18 20 18 17  '$ Temp: 98.5 F (36.9 C) 98 F (36.7 C) 99.3 F (37.4 C) 98.5 F (36.9 C)  TempSrc: Oral Oral Oral Oral  SpO2: 98% 100% 99% 99%  Weight:      Height:       FHR : baseline 140 / variability moderate / accelerations present / variable decelerations Toco: no ctx  Membranes:  intact Dilation: Closed Exam by:: N. Nugent, NP on admission,   Phys Exam: Gen: AAO x3, cooperative CV: RRR, no murmurs Resp: clear, unlabored GI: abd soft, gravid, BS x4  Extremities: 1+ non-pitting edema, neg for pain, cords, or tenderness Neuro: 2+ DTRs, neg clonus  Discharge Medications:  Allergies as of 12/31/2019   No Known Allergies     Medication List    TAKE these medications   FLONASE NA Place into the nose.   Prenatal Vitamins 0.8 MG tablet Take 1 tablet by mouth daily.   VITAMIN B6 PO Take by mouth.   XYZAL PO Take by mouth.      Diet: low salt diet Activity: Advance as tolerated. Pelvic rest x 6 weeks.  Follow up:2-3 days w/ [facility] for NST and routine OB visit. IOL sched at 37 wks per recommendation of MFM.   Signed: VArrie EasternCNM, MSN 01/02/2020, 3:15 PM

## 2020-01-04 ENCOUNTER — Encounter (HOSPITAL_COMMUNITY): Payer: Self-pay | Admitting: *Deleted

## 2020-01-04 ENCOUNTER — Other Ambulatory Visit: Payer: Self-pay

## 2020-01-04 ENCOUNTER — Telehealth (HOSPITAL_COMMUNITY): Payer: Self-pay | Admitting: *Deleted

## 2020-01-04 NOTE — Telephone Encounter (Signed)
Preadmission screen  

## 2020-01-05 ENCOUNTER — Encounter (HOSPITAL_COMMUNITY): Payer: Self-pay | Admitting: *Deleted

## 2020-01-05 ENCOUNTER — Telehealth (HOSPITAL_COMMUNITY): Payer: Self-pay | Admitting: *Deleted

## 2020-01-05 ENCOUNTER — Other Ambulatory Visit: Payer: Self-pay | Admitting: Obstetrics and Gynecology

## 2020-01-05 NOTE — Telephone Encounter (Signed)
Preadmission screen  

## 2020-01-06 ENCOUNTER — Other Ambulatory Visit (HOSPITAL_COMMUNITY)
Admission: RE | Admit: 2020-01-06 | Discharge: 2020-01-06 | Disposition: A | Discharge status: Home / Self Care | Payer: 59 | Source: Ambulatory Visit | Attending: Obstetrics & Gynecology | Admitting: Obstetrics & Gynecology

## 2020-01-06 DIAGNOSIS — Z01812 Encounter for preprocedural laboratory examination: Secondary | ICD-10-CM | POA: Insufficient documentation

## 2020-01-06 DIAGNOSIS — Z20822 Contact with and (suspected) exposure to covid-19: Secondary | ICD-10-CM | POA: Insufficient documentation

## 2020-01-06 LAB — SARS CORONAVIRUS 2 (TAT 6-24 HRS): SARS Coronavirus 2: NEGATIVE

## 2020-01-07 MED ORDER — LIDOCAINE HCL (PF) 1 % IJ SOLN
INTRAMUSCULAR | Status: AC
  Filled 2020-01-07: qty 30

## 2020-01-08 ENCOUNTER — Inpatient Hospital Stay (HOSPITAL_COMMUNITY): Payer: 59 | Admitting: Anesthesiology

## 2020-01-08 ENCOUNTER — Encounter (HOSPITAL_COMMUNITY): Payer: Self-pay | Admitting: Obstetrics & Gynecology

## 2020-01-08 ENCOUNTER — Inpatient Hospital Stay (HOSPITAL_COMMUNITY)
Admission: AD | Admit: 2020-01-08 | Discharge: 2020-01-11 | DRG: 805 | Disposition: A | Discharge status: Home / Self Care | Payer: 59 | Attending: Obstetrics and Gynecology | Admitting: Obstetrics and Gynecology

## 2020-01-08 ENCOUNTER — Other Ambulatory Visit: Payer: Self-pay

## 2020-01-08 ENCOUNTER — Inpatient Hospital Stay (HOSPITAL_COMMUNITY): Payer: 59

## 2020-01-08 DIAGNOSIS — Z3A37 37 weeks gestation of pregnancy: Secondary | ICD-10-CM

## 2020-01-08 DIAGNOSIS — Z87891 Personal history of nicotine dependence: Secondary | ICD-10-CM | POA: Diagnosis not present

## 2020-01-08 DIAGNOSIS — E282 Polycystic ovarian syndrome: Secondary | ICD-10-CM | POA: Diagnosis present

## 2020-01-08 DIAGNOSIS — O99284 Endocrine, nutritional and metabolic diseases complicating childbirth: Secondary | ICD-10-CM | POA: Diagnosis present

## 2020-01-08 DIAGNOSIS — Z20822 Contact with and (suspected) exposure to covid-19: Secondary | ICD-10-CM | POA: Diagnosis present

## 2020-01-08 DIAGNOSIS — O411230 Chorioamnionitis, third trimester, not applicable or unspecified: Secondary | ICD-10-CM | POA: Diagnosis present

## 2020-01-08 DIAGNOSIS — O139 Gestational [pregnancy-induced] hypertension without significant proteinuria, unspecified trimester: Secondary | ICD-10-CM | POA: Diagnosis present

## 2020-01-08 DIAGNOSIS — O134 Gestational [pregnancy-induced] hypertension without significant proteinuria, complicating childbirth: Secondary | ICD-10-CM | POA: Diagnosis present

## 2020-01-08 DIAGNOSIS — O99214 Obesity complicating childbirth: Secondary | ICD-10-CM | POA: Diagnosis present

## 2020-01-08 DIAGNOSIS — O99213 Obesity complicating pregnancy, third trimester: Secondary | ICD-10-CM | POA: Diagnosis present

## 2020-01-08 DIAGNOSIS — Z3490 Encounter for supervision of normal pregnancy, unspecified, unspecified trimester: Secondary | ICD-10-CM | POA: Diagnosis present

## 2020-01-08 LAB — CBC
HCT: 38.8 % (ref 36.0–46.0)
HCT: 39.1 % (ref 36.0–46.0)
Hemoglobin: 13.2 g/dL (ref 12.0–15.0)
Hemoglobin: 13.3 g/dL (ref 12.0–15.0)
MCH: 29.3 pg (ref 26.0–34.0)
MCH: 29.3 pg (ref 26.0–34.0)
MCHC: 34 g/dL (ref 30.0–36.0)
MCHC: 34 g/dL (ref 30.0–36.0)
MCV: 86 fL (ref 80.0–100.0)
MCV: 86.1 fL (ref 80.0–100.0)
Platelets: 198 10*3/uL (ref 150–400)
Platelets: 214 10*3/uL (ref 150–400)
RBC: 4.51 MIL/uL (ref 3.87–5.11)
RBC: 4.54 MIL/uL (ref 3.87–5.11)
RDW: 12.9 % (ref 11.5–15.5)
RDW: 13.1 % (ref 11.5–15.5)
WBC: 10.5 10*3/uL (ref 4.0–10.5)
WBC: 14.5 10*3/uL — ABNORMAL HIGH (ref 4.0–10.5)
nRBC: 0 % (ref 0.0–0.2)
nRBC: 0 % (ref 0.0–0.2)

## 2020-01-08 LAB — TYPE AND SCREEN
ABO/RH(D): A POS
Antibody Screen: NEGATIVE

## 2020-01-08 LAB — COMPREHENSIVE METABOLIC PANEL
ALT: 25 U/L (ref 0–44)
AST: 19 U/L (ref 15–41)
Albumin: 2.6 g/dL — ABNORMAL LOW (ref 3.5–5.0)
Alkaline Phosphatase: 105 U/L (ref 38–126)
Anion gap: 10 (ref 5–15)
BUN: 9 mg/dL (ref 6–20)
CO2: 22 mmol/L (ref 22–32)
Calcium: 9.6 mg/dL (ref 8.9–10.3)
Chloride: 103 mmol/L (ref 98–111)
Creatinine, Ser: 0.45 mg/dL (ref 0.44–1.00)
GFR calc Af Amer: >60 mL/min (ref >60)
GFR calc non Af Amer: >60 mL/min (ref >60)
Glucose, Bld: 187 mg/dL — ABNORMAL HIGH (ref 70–99)
Potassium: 3.9 mmol/L (ref 3.5–5.1)
Sodium: 135 mmol/L (ref 135–145)
Total Bilirubin: 0.2 mg/dL — ABNORMAL LOW (ref 0.3–1.2)
Total Protein: 6.2 g/dL — ABNORMAL LOW (ref 6.5–8.1)

## 2020-01-08 LAB — RPR: RPR Ser Ql: NONREACTIVE

## 2020-01-08 LAB — PROTEIN / CREATININE RATIO, URINE
Creatinine, Urine: 42.79 mg/dL
Protein Creatinine Ratio: 0.26 mg/mg{Cre} — ABNORMAL HIGH (ref 0.00–0.15)
Total Protein, Urine: 11 mg/dL

## 2020-01-08 MED ORDER — MISOPROSTOL 25 MCG QUARTER TABLET: 25.0000 ug | ORAL_TABLET | ORAL | Status: DC | PRN

## 2020-01-08 MED ORDER — FLEET ENEMA 7-19 GM/118ML RE ENEM: 1.0000 | ENEMA | RECTAL | Status: DC | PRN

## 2020-01-08 MED ORDER — MISOPROSTOL 100 MCG PO TABS
25.0000 ug | ORAL_TABLET | ORAL | Status: DC | PRN
  Administered 2020-01-08: 25 ug via VAGINAL
  Filled 2020-01-08: qty 1

## 2020-01-08 MED ORDER — FENTANYL-BUPIVACAINE-NACL 0.5-0.125-0.9 MG/250ML-% EP SOLN
EPIDURAL | Status: AC
  Filled 2020-01-08: qty 250

## 2020-01-08 MED ORDER — ACETAMINOPHEN 325 MG PO TABS: 650.0000 mg | ORAL_TABLET | ORAL | Status: DC | PRN

## 2020-01-08 MED ORDER — SODIUM CHLORIDE (PF) 0.9 % IJ SOLN
INTRAMUSCULAR | Status: DC | PRN
  Administered 2020-01-08: 12 mL/h via EPIDURAL

## 2020-01-08 MED ORDER — LIDOCAINE HCL (PF) 1 % IJ SOLN
INTRAMUSCULAR | Status: DC | PRN
  Administered 2020-01-08: 5 mL via EPIDURAL

## 2020-01-08 MED ORDER — LACTATED RINGERS IV SOLN: 500.0000 mL | INTRAVENOUS | Status: DC | PRN

## 2020-01-08 MED ORDER — OXYTOCIN-SODIUM CHLORIDE 30-0.9 UT/500ML-% IV SOLN: 1.0000 m[IU]/min | INTRAVENOUS | Status: DC

## 2020-01-08 MED ORDER — TERBUTALINE SULFATE 1 MG/ML IJ SOLN: 0.2500 mg | Freq: Once | INTRAMUSCULAR | Status: DC | PRN

## 2020-01-08 MED ORDER — MISOPROSTOL 50MCG HALF TABLET
50.0000 ug | ORAL_TABLET | ORAL | Status: DC | PRN
  Administered 2020-01-08: 50 ug via ORAL
  Filled 2020-01-08: qty 1

## 2020-01-08 MED ORDER — ONDANSETRON HCL 4 MG/2ML IJ SOLN
4.0000 mg | Freq: Four times a day (QID) | INTRAMUSCULAR | Status: DC | PRN
  Administered 2020-01-08 – 2020-01-09 (×4): 4 mg via INTRAVENOUS
  Filled 2020-01-08 (×4): qty 2

## 2020-01-08 MED ORDER — FENTANYL CITRATE (PF) 100 MCG/2ML IJ SOLN
50.0000 ug | INTRAMUSCULAR | Status: DC | PRN
  Administered 2020-01-08: 100 ug via INTRAVENOUS
  Filled 2020-01-08 (×2): qty 2

## 2020-01-08 MED ORDER — OXYTOCIN BOLUS FROM INFUSION
500.0000 mL | Freq: Once | INTRAVENOUS | Status: AC
  Administered 2020-01-09: 500 mL via INTRAVENOUS

## 2020-01-08 MED ORDER — OXYTOCIN-SODIUM CHLORIDE 30-0.9 UT/500ML-% IV SOLN
1.0000 m[IU]/min | INTRAVENOUS | Status: DC
  Administered 2020-01-08: 1 m[IU]/min via INTRAVENOUS
  Filled 2020-01-08: qty 500

## 2020-01-08 MED ORDER — LIDOCAINE HCL (PF) 1 % IJ SOLN: 30.0000 mL | INTRAMUSCULAR | Status: DC | PRN

## 2020-01-08 MED ORDER — MISOPROSTOL 50MCG HALF TABLET: 50.0000 ug | ORAL_TABLET | ORAL | Status: DC

## 2020-01-08 MED ORDER — LACTATED RINGERS IV SOLN: INTRAVENOUS | Status: DC

## 2020-01-08 MED ORDER — OXYTOCIN-SODIUM CHLORIDE 30-0.9 UT/500ML-% IV SOLN
2.5000 [IU]/h | INTRAVENOUS | Status: DC
  Administered 2020-01-09: 2.5 [IU]/h via INTRAVENOUS
  Filled 2020-01-08: qty 500

## 2020-01-08 MED ORDER — SOD CITRATE-CITRIC ACID 500-334 MG/5ML PO SOLN: 30.0000 mL | ORAL | Status: DC | PRN

## 2020-01-08 NOTE — Progress Notes (Signed)
S: Pt states she is feeling more pain with contractions and is considering epidural. Wife asleep. Coping well. R/B/A of AROM and IUPC placement discussed. All questions answered.   O: Vitals:   01/08/20 1032 01/08/20 1102 01/08/20 1232 01/08/20 1235  BP: 129/72 118/90  132/67  Pulse: 86 70  87  Resp:    18  Temp:   99 F (37.2 C)   TempSrc:   Oral   Weight:      Height:       FHT:  FHR: 145 bpm, variability: moderate,  accelerations:  Present,  decelerations:  Absent UC:   irregular, every 3-5 minutes SVE:   Dilation: 4.5 Effacement (%): 60 Station: -2 Exam by:: Dorisann Frames CNM  Membranes: AROM clear fluid 1348  A / P: 37.1 weeks IOL d/t gestational hypertension  Reviewed R/B/A of AROM/IUPC placement. Pt agrees to proceed. AROM with clear fluid and IUPC placed without difficulty. Will start low dose Pitocin and titrate until adequate labor is achieved. Will reevaluate SVE at 1800 or sooner if necessary.  Fetal Wellbeing:  Category I Pain Control:  Fentanyl x 1, will get epidural soon Preeclampsia: No signs/symptoms of toxicity, BP normal Anticipated MOD:  NSVD   Dr. Su Hilt aware of plan of care.   June Leap, CNM, MSN 01/08/2020, 2:17 PM

## 2020-01-08 NOTE — Progress Notes (Addendum)
Subjective:    Sitting in throne position and comfortable w/ epidural. Spouse present and supportive.   Objective:    VS: BP 113/73   Pulse (!) 120   Temp 99.7 F (37.6 C) (Oral)   Resp 17   Ht 5' (1.524 m)   Wt 109.4 kg   LMP 04/23/2019   BMI 47.10 kg/m  FHR : baseline 150 / variability minimal / accelerations absent / variable, occasional late decelerations IUPC: contractions every 2.5-4 minutes / MVU 150 Membranes: AROM since 1348, remains clear Dilation: 6.5 Effacement (%): 70 Cervical Position: Middle Station: -2 Presentation: Vertex Exam by:: Osvaldo Human RN   Assessment/Plan:   34 y.o. G1P0000 [redacted]w[redacted]d  Labor: Progressing normally Preeclampsia:  no signs or symptoms of toxicity, intake and ouput balanced and labs stable Fetal Wellbeing:  Category II, position change, IV fluid bolus, and O2 via facemask @ 10L Pain Control:  Epidural I/D:  GBS negative Anticipated MOD:  NSVD  Roma Schanz MSN, CNM 01/08/2020 9:55 PM

## 2020-01-08 NOTE — Anesthesia Procedure Notes (Signed)

## 2020-01-08 NOTE — Progress Notes (Signed)
S: Pt states she is feeling more pain with contractions. Wife at the bedside providing support.   O: Vitals:   01/08/20 0205 01/08/20 0440 01/08/20 0630 01/08/20 0738  BP: 131/85 121/75 113/62 127/69  Pulse: 91 96 94 95  Resp: 18 17 17 16   Temp:  98.8 F (37.1 C)  98.5 F (36.9 C)  TempSrc:  Oral  Oral  Weight:      Height:       FHT:  FHR: 145 bpm, variability: moderate,  accelerations:  Present,  decelerations:  Absent UC:   irregular, every 3-5 minutes SVE:   Dilation: 1 Effacement (%): 70 Station: -3 Exam by:: [email or web address] CNM  A / P: 37.1 weeks IOL d/t gestational hypertension  Reviewed R/B/A of foley balloon placement. Pt agrees to proceed. Cook catheter placed without difficulty and 50cc instilled in uterine balloon. Taped to leg for gravity. Will start low dose Pitocin. Will reevaluate SVE at 1330 or sooner if necessary.  Fetal Wellbeing:  Category I Pain Control:  Labor support without medications, desires epidural eventually Preeclampsia: CBC and CMP WNL, PCR 0.26, normotensive Anticipated MOD:  NSVD  Marchelle Folks, CNM, MSN 01/08/2020, 9:57 AM

## 2020-01-08 NOTE — H&P (Signed)
OB ADMISSION/ HISTORY & PHYSICAL:  Admission Date: 01/08/2020 12:04 AM  Admit Diagnosis: Induction of labor                               Gestational Hypertension  Haley Banks is a 34 y.o. female at 37.1 weeks presenting for induction of labor due to gestational hypertension. Pt was admitted on 12/30/2019 for gestational hypertension and received 2 doses of betamethasone. Blood pressure has remained in the normal range since being discharged. Pt denies current headache, epigastric pain, or visual changes. Denies vaginal bleeding or leaking of fluid. States she feels intermittent contractions. Endorses + fetal movement. Partner at the bedside providing support. Eagerly anticipating the arrival of baby boy, [place].   Prenatal History: G1P0000   EDC : 01/28/2020, by Last Menstrual Period  Prenatal care at [facility] since 13 weeks   Prenatal course complicated by: 1. Chlamydial infection - Positive at MP visit, treated. TOC negative, recheck at 36 weeks. 2. Maternal obesity complicating pregnancy, childbirth and the puerperium, antepartum - Follow growth, antenatal testing from 32 weeks, delivery by 40 weeks. MFM recommend efw q4wks until delivery 3. History of infertility - female  4. Polycystic ovary syndrome  Prenatal Labs: ABO, Rh: A (11/19 0000)  Antibody: PENDING (06/05 0027) Rubella: Immune (11/19 0000)  RPR: Nonreactive (11/19 0000) HBsAg: Negative (11/19 0000)  HIV: Non-reactive (11/19 0000)  GBS: NEGATIVE/-- (05/27 1820)  1 hr Glucola : 183, 3 hr GTT WNL Genetic Screening: Low risk female Ultrasound: 12/31/2019 - AFI 12.6, cephalic, posterior placenta, EFW 3607gms, 7lb 15oz, >99%    Maternal Diabetes: No Genetic Screening: Normal Maternal Ultrasounds/Referrals: Normal Fetal Ultrasounds or other Referrals:  None Maternal Substance Abuse:  No Significant Maternal Medications:  None Significant Maternal Lab Results:  Other: Chlamydia + at NOB, treated Other Comments:  None  Medical  / Surgical History : Past medical history:  Past Medical History:  Diagnosis Date   Anemia    Insulin resistance    linked w/PCOS   Medical history non-contributory    Ovarian cyst    PCOS (polycystic ovarian syndrome)    Pregnancy induced hypertension     Past surgical history:  Past Surgical History:  Procedure Laterality Date   NO PAST SURGERIES      Family History:  Family History  Problem Relation Age of Onset   Diabetes Paternal Grandfather    Asthma Mother    Alopecia Mother    Diabetes Father    Hypertension Father     Social History:  reports that she quit smoking about 20 years ago. Her smoking use included e-cigarettes. She has never used smokeless tobacco. She reports previous alcohol use. She reports that she does not use drugs.  Allergies: Patient has no known allergies.   Current Medications at time of admission:  Medications Prior to Admission  Medication Sig Dispense Refill Last Dose   Fluticasone Propionate (FLONASE NA) Place into the nose.   01/07/2020 at Unknown time   Prenatal Multivit-Min-Fe-FA (PRENATAL VITAMINS) 0.8 MG tablet Take 1 tablet by mouth daily. 30 tablet 12 01/07/2020 at Unknown time   Pyridoxine HCl (VITAMIN B6 PO) Take by mouth.   01/07/2020 at Unknown time   Levocetirizine Dihydrochloride (XYZAL PO) Take by mouth.       Review of Systems: Review of Systems  Constitutional: Negative for chills and fever.  HENT: Negative for congestion and sore throat.   Eyes: Negative for blurred  vision and photophobia.  Respiratory: Negative for cough and shortness of breath.   Cardiovascular: Negative for chest pain and leg swelling.  Gastrointestinal: Negative for heartburn, nausea and vomiting.  Musculoskeletal: Negative for back pain.  Neurological: Negative for dizziness and headaches.  Psychiatric/Behavioral: Negative for depression. The patient is not nervous/anxious.    Physical Exam: Vital signs and nursing notes  reviewed. Vitals:   01/08/20 0045  BP: (!) 146/99  Pulse: 100  Temp: 98.6 F (37 C)  Resp: 17  TempSrc: Oral   General: AAO x 3, NAD Heart: RRR Lungs:CTAB Abdomen: Gravid, NT Extremities: no edema  FHR: 140BPM, mod variability, + accels, no decels TOCO: Ctx none  SVE: Closed/50/-3  Labs:   Pending T&S, CBC, RPR  Recent Labs    01/08/20 0023  WBC 10.5  HGB 13.2  HCT 38.8  PLT 214    Assessment:  34 y.o. G1P0000 at [redacted]w[redacted]d here for IOL due to gestational hypertension  1. Gestational hypertension diagnosed at [redacted]w[redacted]d 2. Elevated BP on admission 146/99 3. Category 1 FHT 4. GBS negative  Plan:  1. Admit to birthing suites 2. Pain med/epidural prn 3. Collect CBC, CMP, PCR  4. Cytotec 75mcg buccally 5. Will reassess SVE in 4 hours or sooner if necessary  Dr. Charlesetta Garibaldi notified of admission/plan of care  [place], MSN 01/08/2020, 1:00 AM

## 2020-01-08 NOTE — Anesthesia Preprocedure Evaluation (Signed)
Anesthesia Evaluation  Patient identified by MRN, date of birth, ID band Patient awake    Reviewed: Allergy & Precautions, NPO status , Patient's Chart, lab work & pertinent test results  Airway Mallampati: II  TM Distance: >3 FB Neck ROM: Full    Dental no notable dental hx. (+) Teeth Intact, Dental Advisory Given   Pulmonary neg pulmonary ROS, former smoker,    Pulmonary exam normal breath sounds clear to auscultation       Cardiovascular hypertension, Pt. on medications negative cardio ROS Normal cardiovascular exam Rhythm:Regular Rate:Normal     Neuro/Psych negative neurological ROS  negative psych ROS   GI/Hepatic negative GI ROS, Neg liver ROS,   Endo/Other  negative endocrine ROS  Renal/GU negative Renal ROS     Musculoskeletal negative musculoskeletal ROS (+)   Abdominal (+) + obese,   Peds  Hematology Lab Results      Component                Value               Date                      WBC                      14.5 (H)            01/08/2020                HGB                      13.3                01/08/2020                HCT                      39.1                01/08/2020                MCV                      86.1                01/08/2020                PLT                      198                 01/08/2020              Anesthesia Other Findings   Reproductive/Obstetrics (+) Pregnancy                             Anesthesia Physical Anesthesia Plan  ASA: III  Anesthesia Plan: Epidural   Post-op Pain Management:    Induction:   PONV Risk Score and Plan:   Airway Management Planned:   Additional Equipment:   Intra-op Plan:   Post-operative Plan:   Informed Consent: I have reviewed the patients History and Physical, chart, labs and discussed the procedure including the risks, benefits and alternatives for the proposed anesthesia with the patient or  authorized representative who has indicated his/her understanding and acceptance.  Dental advisory given  Plan Discussed with: CRNA  Anesthesia Plan Comments: (37.1 Wk G2P1 w Pre E for LEA)        Anesthesia Quick Evaluation

## 2020-01-09 ENCOUNTER — Encounter (HOSPITAL_COMMUNITY): Payer: Self-pay | Admitting: Obstetrics & Gynecology

## 2020-01-09 MED ORDER — SENNOSIDES-DOCUSATE SODIUM 8.6-50 MG PO TABS
2.0000 | ORAL_TABLET | ORAL | Status: DC
  Administered 2020-01-10 (×2): 2 via ORAL
  Filled 2020-01-09 (×2): qty 2

## 2020-01-09 MED ORDER — FENTANYL-BUPIVACAINE-NACL 0.5-0.125-0.9 MG/250ML-% EP SOLN
EPIDURAL | Status: AC
  Administered 2020-01-09: 500 ug
  Filled 2020-01-09: qty 250

## 2020-01-09 MED ORDER — COCONUT OIL OIL: 1.0000 "application " | TOPICAL_OIL | Status: DC | PRN

## 2020-01-09 MED ORDER — SODIUM CHLORIDE 0.9 % IV SOLN
3.0000 g | Freq: Four times a day (QID) | INTRAVENOUS | Status: DC
  Administered 2020-01-09 – 2020-01-10 (×5): 3 g via INTRAVENOUS
  Filled 2020-01-09: qty 8
  Filled 2020-01-09 (×2): qty 3
  Filled 2020-01-09: qty 8
  Filled 2020-01-09 (×3): qty 3

## 2020-01-09 MED ORDER — DIBUCAINE (PERIANAL) 1 % EX OINT: 1.0000 "application " | TOPICAL_OINTMENT | CUTANEOUS | Status: DC | PRN

## 2020-01-09 MED ORDER — ONDANSETRON HCL 4 MG PO TABS: 4.0000 mg | ORAL_TABLET | ORAL | Status: DC | PRN

## 2020-01-09 MED ORDER — ONDANSETRON HCL 4 MG/2ML IJ SOLN: 4.0000 mg | INTRAMUSCULAR | Status: DC | PRN

## 2020-01-09 MED ORDER — TETANUS-DIPHTH-ACELL PERTUSSIS 5-2.5-18.5 LF-MCG/0.5 IM SUSP
0.5000 mL | Freq: Once | INTRAMUSCULAR | Status: AC
  Administered 2020-01-10: 0.5 mL via INTRAMUSCULAR
  Filled 2020-01-09: qty 0.5

## 2020-01-09 MED ORDER — DIPHENHYDRAMINE HCL 25 MG PO CAPS: 25.0000 mg | ORAL_CAPSULE | Freq: Four times a day (QID) | ORAL | Status: DC | PRN

## 2020-01-09 MED ORDER — WITCH HAZEL-GLYCERIN EX PADS: 1.0000 "application " | MEDICATED_PAD | CUTANEOUS | Status: DC | PRN

## 2020-01-09 MED ORDER — ACETAMINOPHEN 500 MG PO TABS
1000.0000 mg | ORAL_TABLET | ORAL | Status: AC
  Administered 2020-01-09: 1000 mg via ORAL
  Filled 2020-01-09: qty 2

## 2020-01-09 MED ORDER — IBUPROFEN 600 MG PO TABS
600.0000 mg | ORAL_TABLET | Freq: Four times a day (QID) | ORAL | Status: DC
  Administered 2020-01-09 – 2020-01-11 (×8): 600 mg via ORAL
  Filled 2020-01-09 (×8): qty 1

## 2020-01-09 MED ORDER — SIMETHICONE 80 MG PO CHEW: 80.0000 mg | CHEWABLE_TABLET | ORAL | Status: DC | PRN

## 2020-01-09 MED ORDER — BENZOCAINE-MENTHOL 20-0.5 % EX AERO
1.0000 "application " | INHALATION_SPRAY | Freq: Three times a day (TID) | CUTANEOUS | Status: DC
  Administered 2020-01-09 – 2020-01-11 (×7): 1 via TOPICAL
  Filled 2020-01-09 (×2): qty 56

## 2020-01-09 MED ORDER — PRENATAL MULTIVITAMIN CH
1.0000 | ORAL_TABLET | Freq: Every day | ORAL | Status: DC
  Administered 2020-01-10 – 2020-01-11 (×2): 1 via ORAL
  Filled 2020-01-09 (×2): qty 1

## 2020-01-09 MED ORDER — ACETAMINOPHEN 325 MG PO TABS: 650.0000 mg | ORAL_TABLET | ORAL | Status: DC | PRN

## 2020-01-09 NOTE — Progress Notes (Signed)
Subjective:    Feeling pressure w/ ctx  Objective:    VS: BP (!) 99/51   Pulse (!) 108   Temp 99.9 F (37.7 C) (Oral)   Resp 17   Ht 5' (1.524 m)   Wt 109.4 kg   LMP 04/23/2019   SpO2 96%   BMI 47.10 kg/m  FHR : baseline 150 / variability moderate / accelerations absent / early, varaible decelerations IUPC: contractions every 2-3 minutes / MVU 140-160 Membranes: AROM since 1348, fluid clear, febrile @ 0720 100.6 Dilation: Lip/rim Effacement (%): 100 Cervical Position: Middle Station: Plus 1 Presentation: Vertex Exam by:: Rhea Pink CNM  Assessment/Plan:   34 y.o. G1P0000 [redacted]w[redacted]d  Labor: Progressing normally Preeclampsia:  no signs or symptoms of toxicity Fetal Wellbeing:  Category II Pain Control:  Epidural I/D:  possible chorio, started Unasyn 3G Q6 x24 hrs, Tylenol 1G PO. Anticipated MOD:  NSVD  Roma Schanz MSN, CNM 01/09/2020 10:08 AM

## 2020-01-09 NOTE — Plan of Care (Signed)
  Problem: Education: Goal: Knowledge of Childbirth will improve Outcome: Completed/Met Goal: Ability to make informed decisions regarding treatment and plan of care will improve Outcome: Completed/Met Goal: Ability to state and carry out methods to decrease the pain will improve Outcome: Completed/Met Goal: Individualized Educational Video(s) Outcome: Completed/Met   Problem: Coping: Goal: Ability to verbalize concerns and feelings about labor and delivery will improve Outcome: Completed/Met   Problem: Life Cycle: Goal: Ability to make normal progression through stages of labor will improve Outcome: Completed/Met Goal: Ability to effectively push during vaginal delivery will improve Outcome: Completed/Met   Problem: Role Relationship: Goal: Will demonstrate positive interactions with the child Outcome: Completed/Met   Problem: Safety: Goal: Risk of complications during labor and delivery will decrease Outcome: Completed/Met   Problem: Pain Management: Goal: Relief or control of pain from uterine contractions will improve Outcome: Completed/Met   Problem: Education: Goal: Knowledge of General Education information will improve Description: Including pain rating scale, medication(s)/side effects and non-pharmacologic comfort measures Outcome: Completed/Met   Problem: Health Behavior/Discharge Planning: Goal: Ability to manage health-related needs will improve Outcome: Completed/Met   Problem: Clinical Measurements: Goal: Ability to maintain clinical measurements within normal limits will improve Outcome: Completed/Met Goal: Will remain free from infection Outcome: Completed/Met Goal: Diagnostic test results will improve Outcome: Completed/Met Goal: Respiratory complications will improve Outcome: Completed/Met Goal: Cardiovascular complication will be avoided Outcome: Completed/Met   Problem: Activity: Goal: Risk for activity intolerance will decrease Outcome:  Completed/Met   Problem: Nutrition: Goal: Adequate nutrition will be maintained Outcome: Completed/Met   Problem: Coping: Goal: Level of anxiety will decrease Outcome: Completed/Met   Problem: Elimination: Goal: Will not experience complications related to bowel motility Outcome: Completed/Met Goal: Will not experience complications related to urinary retention Outcome: Completed/Met   Problem: Pain Managment: Goal: General experience of comfort will improve Outcome: Completed/Met   Problem: Safety: Goal: Ability to remain free from injury will improve Outcome: Completed/Met   Problem: Skin Integrity: Goal: Risk for impaired skin integrity will decrease Outcome: Completed/Met   Problem: Education: Goal: Knowledge of condition will improve Outcome: Completed/Met Goal: Individualized Educational Video(s) Outcome: Completed/Met Goal: Individualized Newborn Educational Video(s) Outcome: Completed/Met   Problem: Activity: Goal: Will verbalize the importance of balancing activity with adequate rest periods Outcome: Completed/Met Goal: Ability to tolerate increased activity will improve Outcome: Completed/Met   Problem: Coping: Goal: Ability to identify and utilize available resources and services will improve Outcome: Completed/Met   Problem: Life Cycle: Goal: Chance of risk for complications during the postpartum period will decrease Outcome: Completed/Met   Problem: Role Relationship: Goal: Ability to demonstrate positive interaction with newborn will improve Outcome: Completed/Met   Problem: Skin Integrity: Goal: Demonstration of wound healing without infection will improve Outcome: Completed/Met

## 2020-01-09 NOTE — Progress Notes (Signed)
Subjective:    Comfortable w/ multiple repositions and use of peanut ball.   Objective:    VS: BP (!) 99/56   Pulse (!) 104   Temp 99.5 F (37.5 C) (Oral)   Resp 17   Ht 5' (1.524 m)   Wt 109.4 kg   LMP 04/23/2019   SpO2 98%   BMI 47.10 kg/m  FHR : baseline 150 / variability moderate / accelerations present / absent decelerations IUPC dislodged, new IUPC reinserted Membranes: AROM since 1348, fluid remains clear, mother aferile Dilation: 8 Effacement (%): 80 Cervical Position: Middle Station: -1 Presentation: Vertex Exam by:: Rhea Pink CNM  Pitocin 10 mU  Assessment/Plan:   34 y.o. G1P0000 [redacted]w[redacted]d  Labor: Progressing normally Preeclampsia:  no signs or symptoms of toxicity and intake and ouput balanced Fetal Wellbeing:  Category I Pain Control:  Epidural I/D:  GBS negative Anticipated MOD:  NSVD  Roma Schanz MSN, CNM 01/09/2020 4:43 AM

## 2020-01-09 NOTE — Progress Notes (Signed)
Subjective:    Comfortable w/ epidural, peanut ball in use w/ pt in right sims.   Objective:    VS: BP (!) 108/51    Pulse 95    Temp 98.8 F (37.1 C) (Oral)    Resp 17    Ht 5' (1.524 m)    Wt 109.4 kg    LMP 04/23/2019    BMI 47.10 kg/m  FHR : baseline 150 / variability moderate / accelerations [resent / absent decelerations IUPC: contractions every 2 minutes / MVU 150 Membranes: AROM since 1348, remains clear Dilation: 6.5 Effacement (%): 80 Cervical Position: Middle Station: -3 Presentation: Vertex Exam by:: Rhea Pink CNM  Pitocin 9 mU/min  Assessment/Plan:   34 y.o. G1P0000 [redacted]w[redacted]d  Labor: no change, RN to increase Pitocin per orders to achieve MVU 180-200 Preeclampsia:  no signs or symptoms of toxicity and intake and ouput balanced Fetal Wellbeing:  Category I Pain Control:  Epidural I/D:  GBS negative Anticipated MOD:  NSVD  Roma Schanz MSN, CNM 01/09/2020 12:01 AM

## 2020-01-09 NOTE — Lactation Note (Signed)
This note was copied from a baby's chart. Lactation Consultation Note  Patient Name: Haley Banks ZOXWR'U Date: 01/09/2020 Reason for consult: Initial assessment;Early term 37-38.6wks;Primapara;1st time breastfeeding  P1 mother whose infant is now 40 hours old.  This is an ETI at 37+2 weeks.  Mother has a history of PCOS.  Baby was swaddled and asleep in mother's arms when I arrived.  Much education provided regarding breast feeding basics including STS, tummy size, hand expression, latching and feeding cues.  I did not review the LPTI policy guidelines at this time.  Will plan to see how baby feeds before discussing supplementation.  With mother's history of PCOS I suggested she begin pumping and mother very willing to initiate.  Pump parts, assembly, disassembly and cleaning reviewed.  Mother was able to return demonstrate pump assembly.  Observed her pumping and #24 flange size is appropriate at this time.  She may need to increase to a #27 in the future and educated her on how to know if her flange size needs to increase.  After 15 minutes of pumping, mother was unable to obtain any colostrum drops.  Hand expression taught and containers provided.  Milk storage times discussed and finger feeding demonstrated.  Mother will feed with cues or at least every three hours.  She will practice hand expression and post pump for 15 minutes.  Discussed care of nipples/areolas and provided coconut oil for comfort.  Mother also has her own organic butter to use as desired.  She will call for latch assistance as needed.  RN updated.  Mother has a DEBP for home use.  Mom made aware of O/P services, breastfeeding support groups, community resources, and our phone # for post-discharge questions. Mother's partner is present and very involved in the care and newborn experience.     Maternal Data Formula Feeding for Exclusion: No Has patient been taught Hand Expression?: Yes Does the patient have  breastfeeding experience prior to this delivery?: No  Feeding Feeding Type: Breast Fed  LATCH Score Latch: Repeated attempts needed to sustain latch, nipple held in mouth throughout feeding, stimulation needed to elicit sucking reflex.  Audible Swallowing: Spontaneous and intermittent  Type of Nipple: Everted at rest and after stimulation  Comfort (Breast/Nipple): Soft / non-tender  Hold (Positioning): Assistance needed to correctly position infant at breast and maintain latch.  LATCH Score: 8  Interventions    Lactation Tools Discussed/Used Pump Review: Setup, frequency, and cleaning;Milk Storage Initiated by:: Urias Sheek Date initiated:: 01/10/20   Consult Status Consult Status: Follow-up Date: 01/10/20 Follow-up type: In-patient    Dora Sims 01/09/2020, 4:43 PM

## 2020-01-10 LAB — CBC
HCT: 36.5 % (ref 36.0–46.0)
Hemoglobin: 12.2 g/dL (ref 12.0–15.0)
MCH: 29.3 pg (ref 26.0–34.0)
MCHC: 33.4 g/dL (ref 30.0–36.0)
MCV: 87.7 fL (ref 80.0–100.0)
Platelets: 194 10*3/uL (ref 150–400)
RBC: 4.16 MIL/uL (ref 3.87–5.11)
RDW: 13.3 % (ref 11.5–15.5)
WBC: 19.3 10*3/uL — ABNORMAL HIGH (ref 4.0–10.5)
nRBC: 0 % (ref 0.0–0.2)

## 2020-01-10 NOTE — Lactation Note (Signed)
This note was copied from a baby's chart. Lactation Consultation Note  Patient Name: Haley Banks NIDPO'E Date: 01/10/2020 Reason for consult: Follow-up assessment;Early term 37-38.6wks;Primapara;1st time breastfeeding  P1 mother whose infant is now 15 hours old.  This is an ETI at 37+2 weeks.  Mother has a history of PCOS.  Returned to visit with parents and assess the need for a possible frenotomy.    Mother had baby in her arms and attempting to breast feed when I arrived.  Baby was swaddled and asleep.  Re-educated on breast feeding basics and asked mother to always feed STS. Asked permission to remove swaddle and awaken baby.  As soon as I removed the blanket he began to arouse.  Suck training performed and baby initially required assistance to grasp my finger.  With good cheek and jaw support he moved from a "biting" motion to a "sucking" motion.  I observed a thin lingual frenulum, however he was able to lateralize tongue movement and cup my finger firmly.  After continued suck training he started to relax and tongue was able to protrude over lower gum line at rest.  Attempted to latch to the left breast in the football hole but he was not at all interested.  Gentle stimulation performed but no interest shown.  Suggested we provide supplementation and develop a feeding plan.    Discussed donor breast milk as a supplement instead of formula at my last visit and parents in agreement that this would be best.  RN obtained consent and ordered enough volume to last throughout the night.  Mother had 2 mls of EBM at bedside which was fed via curved tip syringe without difficulty.  Reviewed supplementation guidelines and asked parents to aim for a goal of 20 mls after every feeding.  Demonstrated paced bottle feeding.  Again, with cheek and jaw support baby nippled well using the purple extra slow flow nipple.  Demonstrated effective burping and he burped well.    Feeding plan reviewed and written  on white board.  Parents very appreciative of help provided.  They work well together and want to do everything possible to be sure they are taking care of their son.  Encouraged to call for latch assistance as needed.  RN updated.   Maternal Data Formula Feeding for Exclusion: No Has patient been taught Hand Expression?: Yes  Feeding    LATCH Score Latch: Too sleepy or reluctant, no latch achieved, no sucking elicited.  Audible Swallowing: None  Type of Nipple: Everted at rest and after stimulation(short shafted)  Comfort (Breast/Nipple): Soft / non-tender  Hold (Positioning): Assistance needed to correctly position infant at breast and maintain latch.  LATCH Score: 5  Interventions Interventions: Breast feeding basics reviewed;Assisted with latch;Skin to skin;Breast massage;Hand express;Breast compression;Adjust position;DEBP;Expressed milk;Position options;Support pillows  Lactation Tools Discussed/Used     Consult Status Consult Status: Follow-up Date: 01/11/20 Follow-up type: In-patient    Rithy Mandley R Dewayne Severe 01/10/2020, 4:53 PM

## 2020-01-10 NOTE — Progress Notes (Signed)
Subjective: Postpartum Day # 1 : S/P NSVD due to IOL for GHTN, with shoulder dystocia during delivery. Pt denies HA, RUQ pain or vision changes. . Patient up ad lib, denies syncope or dizziness. Reports consuming regular diet without issues and denies N/V. Patient reports 0 bowel movement + passing flatus.  Denies issues with urination and reports bleeding is "lighter."  Patient is breastfeeding and reports going well.  Desires nothing for postpartum contraception.  Pain is being appropriately managed with use of po meds.   1st laceration Feeding:  breast Contraceptive plan:  nothing BB: Circ out pt desired.   Objective: Vital signs in last 24 hours: Patient Vitals for the past 24 hrs:  BP Temp Temp src Pulse Resp SpO2  01/10/20 0530 129/81 97.7 F (36.5 C) Oral 88 16 --  01/10/20 0105 138/82 98.3 F (36.8 C) Oral 98 18 99 %  01/09/20 2138 (!) 146/86 98.1 F (36.7 C) Oral 92 18 98 %  01/09/20 1655 139/87 98.2 F (36.8 C) Oral 88 18 --  01/09/20 1538 121/75 98.6 F (37 C) Oral 93 18 98 %  01/09/20 1415 127/68 -- -- (!) 114 18 --  01/09/20 1400 121/79 -- -- (!) 109 18 --  01/09/20 1346 (!) 110/94 -- -- 100 18 --  01/09/20 1330 127/74 -- -- (!) 101 18 --  01/09/20 1315 125/73 -- -- (!) 105 18 --  01/09/20 1300 138/71 -- -- 98 18 --     Physical Exam:  General: alert, cooperative, appears stated age and no distress Mood/Affect: Happy Lungs: clear to auscultation, no wheezes, rales or rhonchi, symmetric air entry.  Heart: normal rate, regular rhythm, normal S1, S2, no murmurs, rubs, clicks or gallops. Breast: breasts appear normal, no suspicious masses, no skin or nipple changes or axillary nodes. Abdomen:  + bowel sounds, soft, non-tender GU: perineum approximate, healing well. No signs of external hematomas.  Uterine Fundus: firm Lochia: appropriate Skin: Warm, Dry. DVT Evaluation: No evidence of DVT seen on physical exam. Negative Homan's sign. No cords or calf  tenderness. 1+ Lower extremity pitting edema noted.   CBC Latest Ref Rng & Units 01/10/2020 01/08/2020 01/08/2020  WBC 4.0 - 10.5 K/uL 19.3(H) 14.5(H) 10.5  Hemoglobin 12.0 - 15.0 g/dL 99.2 42.6 83.4  Hematocrit 36.0 - 46.0 % 36.5 39.1 38.8  Platelets 150 - 400 K/uL 194 198 214    Results for orders placed or performed during the hospital encounter of 01/08/20 (from the past 24 hour(s))  CBC     Status: Abnormal   Collection Time: 01/10/20  4:02 AM  Result Value Ref Range   WBC 19.3 (H) 4.0 - 10.5 K/uL   RBC 4.16 3.87 - 5.11 MIL/uL   Hemoglobin 12.2 12.0 - 15.0 g/dL   HCT 19.6 22.2 - 97.9 %   MCV 87.7 80.0 - 100.0 fL   MCH 29.3 26.0 - 34.0 pg   MCHC 33.4 30.0 - 36.0 g/dL   RDW 89.2 11.9 - 41.7 %   Platelets 194 150 - 400 K/uL   nRBC 0.0 0.0 - 0.2 %     CBG (last 3)  No results for input(s): GLUCAP in the last 72 hours.   I/O last 3 completed shifts: In: 200 [IV Piggyback:200] Out: 1790 [Urine:1640; Blood:150]   Assessment Postpartum Day # 1 : S/P NSVD due to IOL for GHTN, no meds, PCR was 0.26, asymptomatic, BP 129/81. Baby female for out pt circ, had shoulder dystocia and moving arms today, stable,  no residual issued noted.  Pt stable. -1 involution. breastfeeding. Hemodynamically stable. Suspected chori during delivery was given 24 hour of abx, afebrile currently.    Plan: Continue other mgmt as ordered GHTN: Monitor BP, if Bp >150/100 will start procardia, report HA, RUQ pain or vision changes.  Shoulder dystocia: Monitor infant arm movement.  Suspected chori: Monitor and report for fever.  VTE prophylactics: Early ambulated as tolerates.  Pain control: Motrin/Tylenol PRN Education given regarding options for contraception was given pt declined.  Plan for discharge tomorrow, Breastfeeding and Lactation consult  Baby Female: Desired out pt circ  Dr. Alwyn Pea to be updated on patient status  [facility] NP-C, CNM 01/10/2020, 10:10 AM

## 2020-01-10 NOTE — Anesthesia Postprocedure Evaluation (Signed)
Anesthesia Post Note  Patient: Haley Banks  Procedure(s) Performed: AN AD HOC LABOR EPIDURAL     Patient location during evaluation: Mother Baby Anesthesia Type: Epidural Level of consciousness: awake and alert Pain management: pain level controlled Vital Signs Assessment: post-procedure vital signs reviewed and stable Respiratory status: spontaneous breathing, nonlabored ventilation and respiratory function stable Cardiovascular status: stable Postop Assessment: no headache, no backache and epidural receding Anesthetic complications: no    Last Vitals:  Vitals:   01/10/20 0105 01/10/20 0530  BP: 138/82 129/81  Pulse: 98 88  Resp: 18 16  Temp: 36.8 C 36.5 C  SpO2: 99%     Last Pain:  Vitals:   01/10/20 0530  TempSrc: Oral  PainSc:    Pain Goal:                   [name]

## 2020-01-10 NOTE — Lactation Note (Signed)
This note was copied from a baby's chart. Lactation Consultation Note  Patient Name: Haley Banks [id number] Date: 01/10/2020 Reason for consult: Follow-up assessment   P1 mother whose infant is now 66 hours old.  This is an ETI at 37+2 weeks.  Mother has a history of PCOS.  NP requested lactation to assess a labial frenulum. NP may be considering a frenotomy later this afternoon.  When I arrived baby was not in the room.  He was getting an extra digit removed from his right hand.    Visited with mother and her partner at length assessing the current feeding situation since I last visited yesterday when their son was only three hours old.  Reviewed the LPTI policy in depth and discussed feeding plan for today.  Baby has not started latching well and remains sleepy.  Mother has been pumping with the DEBP since my visit yesterday and has been very diligent about pumping every three hours.  At this time she has approximately 1 ml of EBM at bedside.  She is planning on latching baby to the breast when he returns.  Discussed the need to begin supplementation at this time.  Both parents in agreement and the supplementation of choice will be donor breast milk.  Suggested 10-20 mls after every breast feeding attempt.  Parents verbalized understanding.  I suggested using an extra slow flow nipple and parents agreeable; will work more on this when baby returns.    Updated RN and she will enter a specific order for donor breast milk to allow enough volume to be given throughout the night.  This order will be placed prior to 1330 per guidelines.  NP not currently on the unit: vocera message left to contact me for an update.  I will further assess labial frenulum when he returns.    Maternal Data    Feeding    LATCH Score                   Interventions    Lactation Tools Discussed/Used     Consult Status Consult Status: Follow-up Date: 01/10/20 Follow-up type:  In-patient    Haley Banks R Haley Banks 01/10/2020, 1:25 PM

## 2020-01-11 LAB — SURGICAL PATHOLOGY

## 2020-01-11 MED ORDER — ACETAMINOPHEN 325 MG PO TABS: 650.0000 mg | ORAL_TABLET | ORAL | Status: AC | PRN

## 2020-01-11 MED ORDER — IBUPROFEN 600 MG PO TABS: 600.0000 mg | ORAL_TABLET | Freq: Four times a day (QID) | ORAL | 0 refills | Status: AC

## 2020-01-11 NOTE — Discharge Summary (Signed)
Postpartum Discharge Summary  Date of Service updated 01/11/20     Patient Name: Haley Banks DOB: 11-Jun-1986 MRN: [id number]  Date of admission: 01/08/2020 Delivery date:01/09/2020  Delivering provider: Burman Foster B  Date of discharge: 01/11/2020  Admitting diagnosis: Obesity complicating pregnancy, third trimester [O99.213] Intrauterine pregnancy: [redacted]w[redacted]d    Secondary diagnosis:  Principal Problem:   Gestational hypertension Active Problems:   SVD (6/6)   Obesity complicating pregnancy, third trimester   Encounter for induction of labor   Shoulder dystocia during labor and delivery, delivered  Additional problems:  Patient Active Problem List   Diagnosis Date Noted  . SVD (6/6) 01/09/2020    Priority: Medium  . Shoulder dystocia during labor and delivery, delivered 01/09/2020  . Obesity complicating pregnancy, third trimester 01/08/2020  . Encounter for induction of labor 01/08/2020  . Gestational hypertension 12/30/2019  . Infertility, female 02/19/2017  . Morbid obesity ([place] 02/19/2017       Discharge diagnosis: Term Pregnancy Delivered and Gestational Hypertension                                              Post partum procedures:none Augmentation: AROM and Pitocin Complications: Shoulder dystocia  Hospital course: Induction of Labor With Vaginal Delivery   34y.o. yo G1P1001 at 314w2das admitted to the hospital 01/08/2020 for induction of labor.  Indication for induction: Gestational hypertension.  Patient had an uncomplicated labor course as follows: Membrane Rupture Time/Date: 1:48 PM ,01/08/2020   Delivery Method:Vaginal, Spontaneous  Episiotomy: None  Lacerations:  1st degree  Details of delivery can be found in separate delivery note.  Patient had a routine postpartum course. Patient is discharged home 01/11/20.  Newborn Data: Birth date:01/09/2020  Birth time:12:42 PM  Gender:Female  Living status:Living  Apgars:1 ,8  Weight:4034 g   Magnesium Sulfate  received: No BMZ received: Yes Rhophylac:N/A MMR:N/A Transfusion:No  Physical exam  Vitals:   01/10/20 0530 01/10/20 1259 01/10/20 1931 01/11/20 0556  BP: 129/81 136/86 132/77 133/83  Pulse: 88 95 100 94  Resp: _0 Temp: 97.7 F (36.5 C) 98.1 F (36.7 C) 98 F (36.7 C) 98.3 F (36.8 C)  TempSrc: Oral Oral Oral Oral  SpO2:  96% 96% 100%  Weight:      Height:       General: alert, cooperative and no distress Lochia: appropriate Uterine Fundus: firm Incision: N/A DVT Evaluation: No evidence of DVT seen on physical exam. No cords or calf tenderness. Labs: Lab Results  Component Value Date   WBC 19.3 (H) 01/10/2020   HGB 12.2 01/10/2020   HCT 36.5 01/10/2020   MCV 87.7 01/10/2020   PLT 194 01/10/2020   CMP Latest Ref Rng & Units 01/08/2020  Glucose 70 - 99 mg/dL 187(H)  BUN 6 - 20 mg/dL 9  Creatinine 0.44 - 1.00 mg/dL 0.45  Sodium 135 - 145 mmol/L 135  Potassium 3.5 - 5.1 mmol/L 3.9  Chloride 98 - 111 mmol/L 103  CO2 22 - 32 mmol/L 22  Calcium 8.9 - 10.3 mg/dL 9.6  Total Protein 6.5 - 8.1 g/dL 6.2(L)  Total Bilirubin 0.3 - 1.2 mg/dL 0.2(L)  Alkaline Phos 38 - 126 U/L 105  AST 15 - 41 U/L 19  ALT 0 - 44 U/L 25   Edinburgh Score: Edinburgh Postnatal Depression Scale Screening Tool 01/10/2020  I have been  able to laugh and see the funny side of things. 0  I have looked forward with enjoyment to things. 0  I have blamed myself unnecessarily when things went wrong. 2  I have been anxious or worried for no good reason. 0  I have felt scared or panicky for no good reason. 2  Things have been getting on top of me. 0  I have been so unhappy that I have had difficulty sleeping. 0  I have felt sad or miserable. 0  I have been so unhappy that I have been crying. 0  The thought of harming myself has occurred to me. 0  Edinburgh Postnatal Depression Scale Total 4      After visit meds:  Allergies as of 01/11/2020   No Known Allergies     Medication List     TAKE these medications   acetaminophen 325 MG tablet Commonly known as: Tylenol Take 2 tablets (650 mg total) by mouth every 4 (four) hours as needed (for pain scale < 4).   FLONASE NA Place into the nose.   ibuprofen 600 MG tablet Commonly known as: ADVIL Take 1 tablet (600 mg total) by mouth every 6 (six) hours.   Prenatal Vitamins 0.8 MG tablet Take 1 tablet by mouth daily.   VITAMIN B6 PO Take by mouth.   XYZAL PO Take by mouth.        Discharge home in stable condition Infant Feeding: Breast Infant Disposition:home with mother Discharge instruction: per After Visit Summary and Postpartum booklet. Activity: Advance as tolerated. Pelvic rest for 6 weeks.  Diet: routine diet Anticipated Birth Control: same sex couple, declines contraception Postpartum Appointment:6 weeks Additional Postpartum F/U: BP check 1 week Future Appointments:No future appointments. Follow up Visit: [facility]. Schedule an appointment as soon as possible for a visit in 1 week(s).   Specialty: Obstetrics and Gynecology Why: Return to [facility] in 1 week for BP check and then in 6 weeks for regular postpartum visit.  Contact information: [address] [place] [address] [phone]              01/11/2020 Arrie Eastern, CNM

## 2020-01-11 NOTE — Lactation Note (Addendum)
This note was copied from a baby's chart. Lactation Consultation Note  Patient Name: Haley Banks Date: 01/11/2020 Reason for consult: Follow-up assessment;  Mother is a P1 , infant is hours old and is now at 8 % wt loss.   Reviewed hand expression with mother. Observed large drops of colostrum. . Mothers nipples are erect with compressible breast tissue. No observed trama of mothers nipples.  Mother has 3 ml collected in colostrum vial at the bedside.    Mother has a DEBP sat up at the bedside. Mother has a PIS at home.   Mother was observed with infant latched on at the left breast. Observed infant suckling on the tip of the nipple. No sustained latch. infant latch for 5-10  mins. Infant poorly latches, mother fit with a #24 NS, infant was given  5 ml of donor milk with a curved tip syringe.  Infant took another 10 ml of donor milk while at the alternate breast . Mother applied the nipple shield well. .  Infant was given  15 ml of donor milk by [facility] for a total of 30 ml for feeding.  Observed that infants jaw is very tight . Parents taught to massage and tug on infants lower jaw for wider gape. Observed a tight thin anterior frenula. Infant rolls tongue to top of palate  and had a difficult time with latching. Nipple shield helped to sustain latch.   Discussed treatment and prevention of engorgement.   Plan of Care : Breastfeed infant with feeding cues Supplement infant with ebm/donor milk.encouraged parents to increase volume according to LPI supplemental guidelines.  Pump using a DEBP after each feeding for 15-20 mins. Parents receptive to all teaching and plan of  Care. Mother will page for assistance with next feeding.   Mother to continue to cue base feed infant and feed at least 8-12 times or more in 24 hours and advised to allow for cluster feeding infant as needed.   Mother to continue to due STS. Mother is aware of available LC services at [facility], BFSG'S, OP Dept, and  phone # for questions or concerns about breastfeeding.  Mother receptive to all teaching and plan of care.     Maternal Data    Feeding Feeding Type: Breast Fed Nipple Type: Other(curved tip syringe)  LATCH Score Latch: Repeated attempts needed to sustain latch, nipple held in mouth throughout feeding, stimulation needed to elicit sucking reflex.  Audible Swallowing: A few with stimulation  Type of Nipple: Everted at rest and after stimulation  Comfort (Breast/Nipple): Soft / non-tender  Hold (Positioning): Assistance needed to correctly position infant at breast and maintain latch.  LATCH Score: 7  Interventions Interventions: Breast feeding basics reviewed;Assisted with latch;Skin to skin;Hand express;Breast compression;Adjust position;Support pillows;Position options;Expressed milk;Hand pump;DEBP  Lactation Tools Discussed/Used Tools: Nipple Shields Nipple shield size: 24   Consult Status Consult Status: Follow-up Date: 01/12/20 Follow-up type: In-patient    Stevan Born [facility] 01/11/2020, 10:33 AM

## 2020-01-12 ENCOUNTER — Ambulatory Visit: Payer: Self-pay

## 2020-01-12 NOTE — Lactation Note (Signed)
This note was copied from a baby's chart. Lactation Consultation Note  Patient Name: Haley Banks BCAFQ'E Date: 01/12/2020 Reason for consult: Follow-up assessment;Early term 58-38.6wks   Baby 46 hours old.  Frenotomy yesterday and mother states latching has improved. Reviewed [facility] Lactation video for post frenotomy exercises. Encouraged mother to continue breastfeeding and pumping. She pumped 14 ml today.  She is hand expressing and pumping.  Praised her for her efforts. Feed on demand with cues.  Goal 8-12+ times per day after first 24 hrs.  Place baby STS if not cueing.  Reviewed engorgement care and monitoring voids/stools. Suggest mother call if she would like LC to view latch.     Maternal Data    Feeding Feeding Type: Breast Fed  LATCH Score                   Interventions Interventions: Breast feeding basics reviewed  Lactation Tools Discussed/Used     Consult Status Consult Status: Complete Date: 01/12/20    [name] [facility] 01/12/2020, 10:39 AM

## 2020-01-28 ENCOUNTER — Encounter (HOSPITAL_COMMUNITY): Payer: 59

## 2021-06-03 IMAGING — US US MFM OB FOLLOW-UP
1 series · 12 of 24 positions shown · non-contrast
Comparison: none

[Series 1: us mfm ob follow-up · 24 acquisitions, 12 frames shown]
[im 2/24]
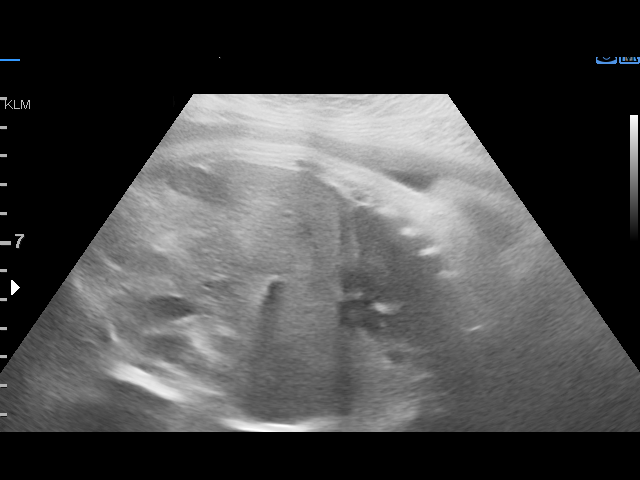
[im 4/24]
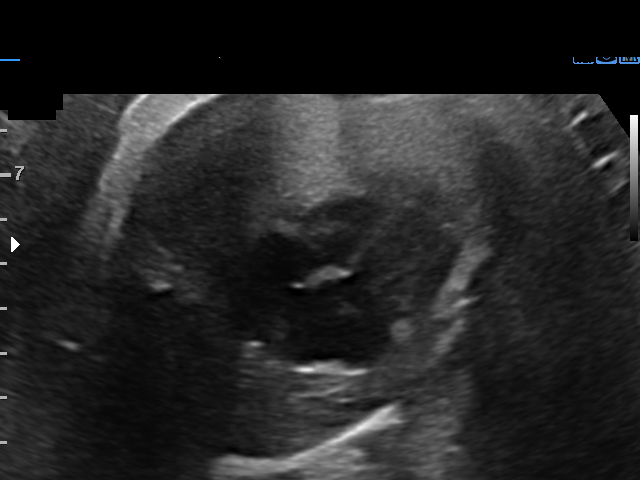
[im 6/24]
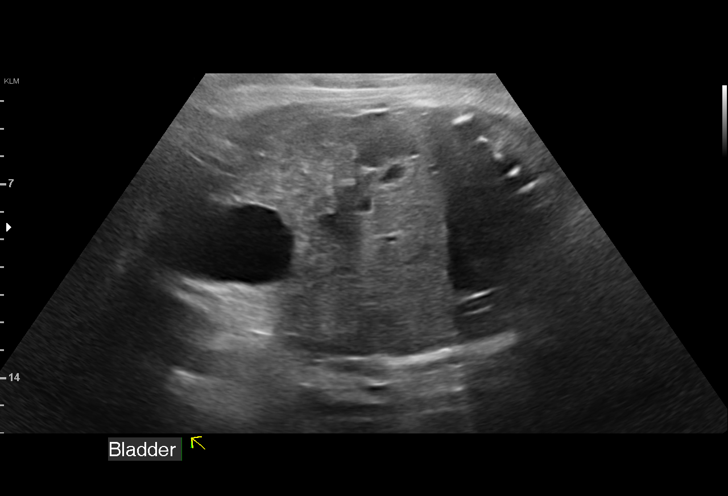
[im 8/24]
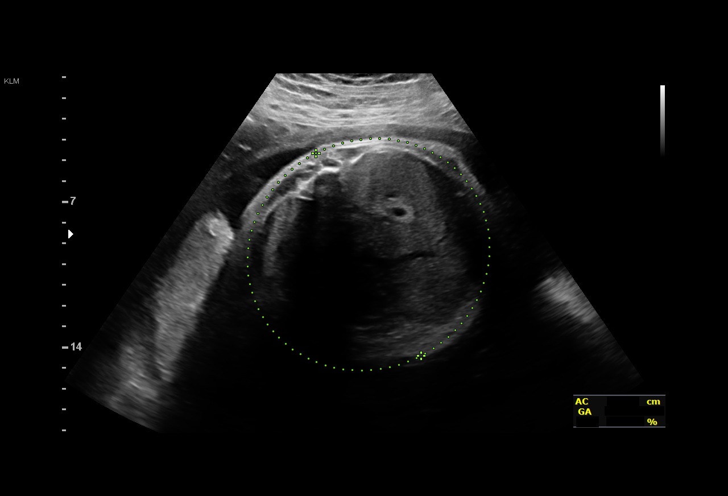
[im 10/24]
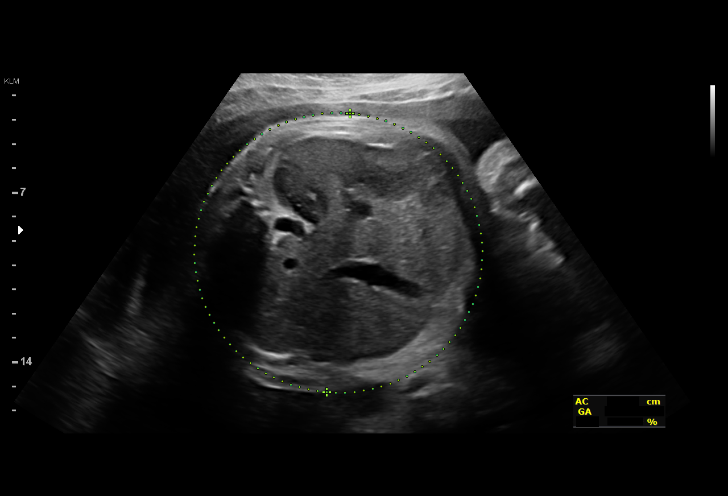
[im 12/24]
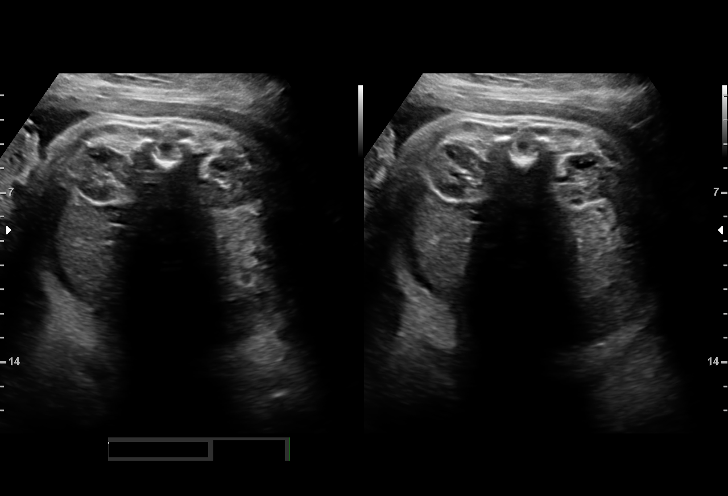
[im 14/24]
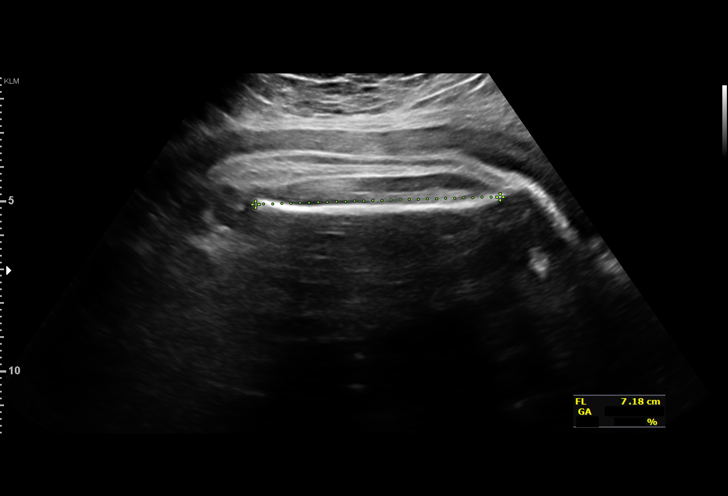
[im 16/24]
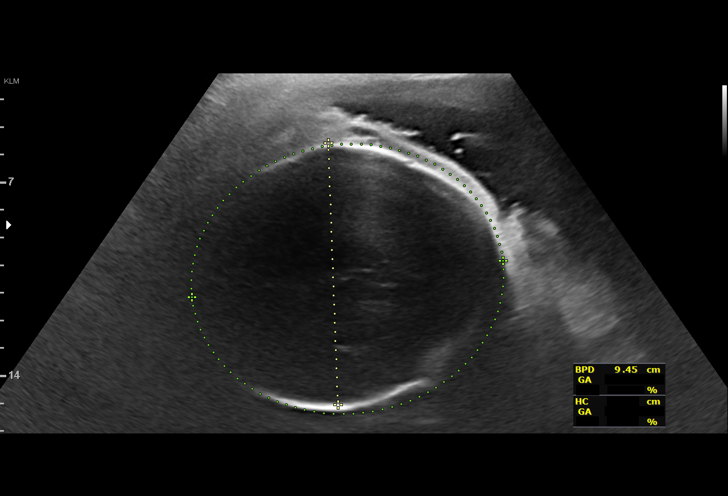
[im 18/24]
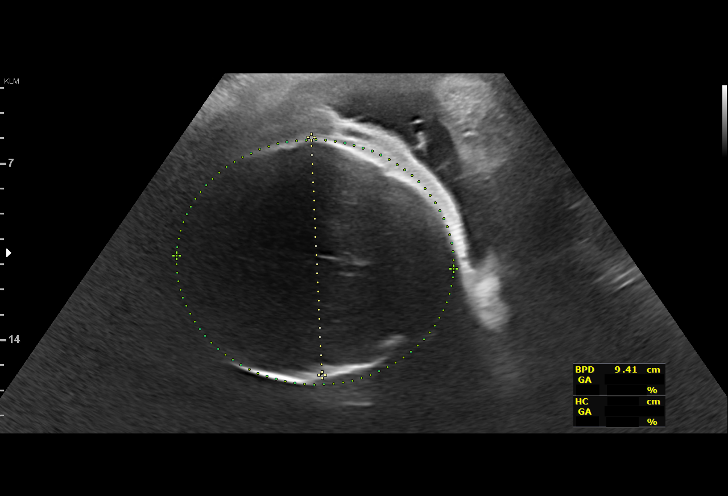
[im 20/24]
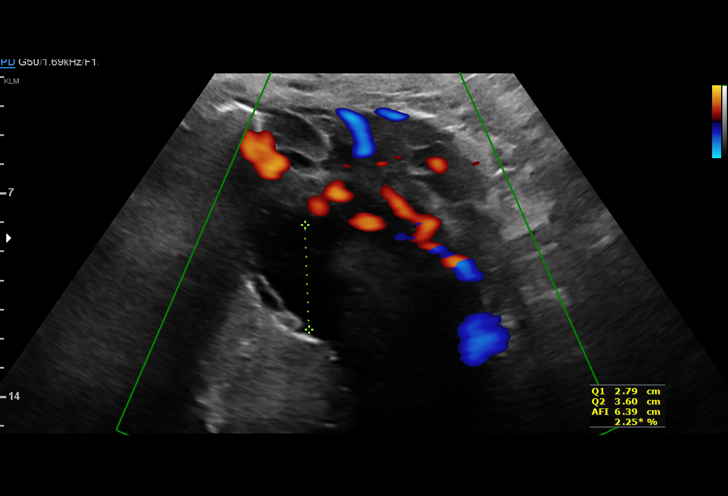
[im 22/24]
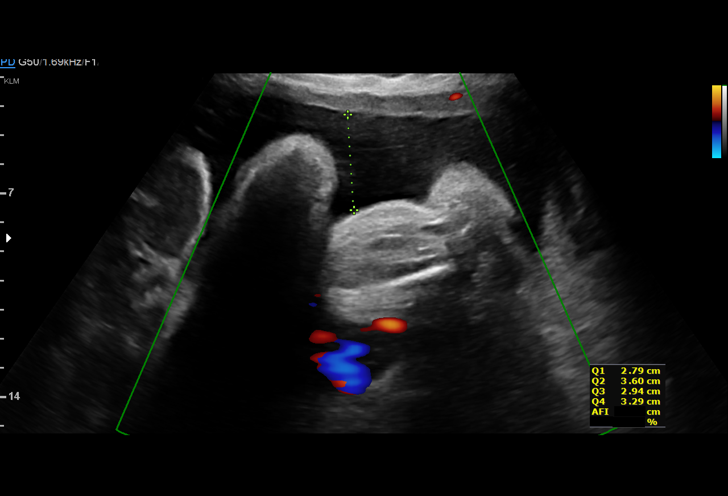
[im 24/24]
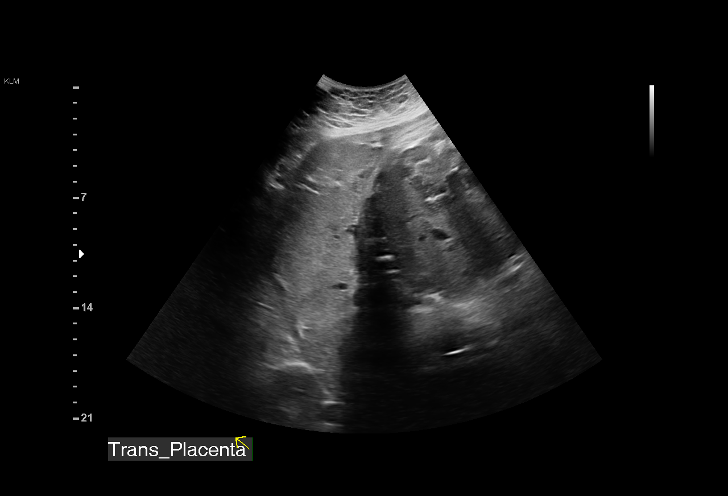

[12 of 24 positions shown; findings below may reference images not displayed]

Indications

 Hypertension - Gestational
 Maternal morbid obesity
 35 weeks gestation of pregnancy
 Encounter for other antenatal screening
 follow-up
Vital Signs

                                                Height:        5'0"
Fetal Evaluation

 Num Of Fetuses:         1
 Fetal Heart Rate(bpm):  157
 Cardiac Activity:       Observed
 Presentation:           Cephalic
 Placenta:               Posterior
 P. Cord Insertion:      Previously Visualized

 Amniotic Fluid
 AFI FV:      Within normal limits

 AFI Sum(cm)     %Tile       Largest Pocket(cm)
 12.6            41

 RUQ(cm)       RLQ(cm)       LUQ(cm)        LLQ(cm)

Biometry

 BPD:      93.6  mm     G. Age:  38w 1d         97  %    CI:        82.03   %    70 - 86
                                                         FL/HC:      22.2   %    20.1 -
 HC:      326.1  mm     G. Age:  37w 0d         46  %    HC/AC:      0.90        0.93 -
 AC:      361.4  mm     G. Age:  40w 0d       > 99  %    FL/BPD:     77.5   %    71 - 87
 FL:       72.5  mm     G. Age:  37w 1d         77  %    FL/AC:      20.1   %    20 - 24
 Est. FW:    0480  gm    7 lb 15 oz    > 99  %
OB History

 Gravidity:    1
Gestational Age

 LMP:           35w 6d        Date:  04/23/19                 EDD:   01/28/20
 U/S Today:     38w 1d                                        EDD:   01/12/20
 Best:          35w 6d     Det. By:  LMP  (04/23/19)          EDD:   01/28/20
Anatomy

 Cranium:               Appears normal         Aortic Arch:            Previously seen
 Cavum:                 Previously seen        Ductal Arch:            Not well visualized
 Ventricles:            Previously seen        Diaphragm:              Previously seen
 Choroid Plexus:        Previously seen        Stomach:                Present but smal, lt
                                                                       sided
 Cerebellum:            Previously seen        Abdomen:                Appears normal
 Posterior Fossa:       Previously seen        Abdominal Wall:         Previously seen
 Nuchal Fold:           Previously seen        Cord Vessels:           Previously seen
 Face:                  Orbits and profile     Kidneys:                Appear normal
                        previously seen
 Lips:                  Previously seen        Bladder:                Appears normal
 Thoracic:              Appears normal         Spine:                  Previously seen
 Heart:                 Appears normal         Upper Extremities:      Previously seen
                        (4CH, axis, and
                        situs)
 RVOT:                  Previously seen        Lower Extremities:      Previously seen
 LVOT:                  Previously seen

 Other:  Male gender previously seen. Heels previously visualized. Nasal bone
         previously visualized. Technically difficult due to maternal habitus and
         fetal position.
Cervix Uterus Adnexa

 Cervix
 Not visualized (advanced GA >17wks)
Comments

 Lene Kristin Toraja is a 34-year-old gravida 1 para 0 currently at
 36 weeks.  She was seen in consultation today due to
 elevated blood pressures most likely due to gestational
 hypertension.  The patient was seen in the office yesterday
 for a nonstress test when she was noted to have elevated
 blood pressures along with a headache.  Due to her elevated
 blood pressures, she was sent to the [facility] for further
 evaluation.  In the [facility], she was noted to have one severe
 range blood pressure of 155/110.  As a result, she was
 admitted for observation.  She is receiving a course of
 antenatal corticosteroids.  Her PIH labs were all within normal
 limits.  Her protein/creatinine ratio was 0.24 indicating that
 she does not have significant proteinuria.  The patient had an
 ultrasound performed last evening which showed an EFW of
 7 pounds 15 ounces (>99%).  The AFI was 12.62 cm. She
 denies any history of chronic hypertension.
 Currently, the patient reports that her headache has resolved.
 Her blood pressures overnight were in the 120s to 150s over
 80s to 90s range.  Her NST is reactive this morning.
 This is the patient's first pregnancy.  She reports a history of
 infertility.  This pregnancy was conceived using intrauterine
 insemination.
 The patient denies any significant past medical history and
 denies any past surgical history.  She reports that she has
 screened negative for gestational diabetes in her current
 pregnancy.
 The implications and management of gestational
 hypertension was discussed in detail today with the patient
 and her partner.  They were advised that gestational
 hypertension [REDACTED]ten progress to preeclampsia.  However,
 it does not appear that she has preeclampsia right now based
 on the negative protein creatinine ratio and her normal PIH
 labs.
 Due to gestational hypertension at 36 weeks, I would
 recommend that the patient receive a complete course of
 antenatal corticosteroids.  Should her blood pressures remain
 in the normal range, should she remain asymptomatic, and
 her fetal status remains reassuring, outpatient management
 may be considered once she receives the second dose of
 Ferienhaus.
 Should the patient be discharged home, I would recommend
 that she be seen for twice weekly fetal testing and blood
 pressure checks in your office.  Due to gestational
 hypertension, delivery should occur at around 37 weeks.  An
 earlier delivery may be indicated should her blood pressures
 persistently be in the 160s over 90s to 100s range, should
 she complain of any signs or symptoms of severe
 preeclampsia, or for nonreassuring fetal status.  The patient
 was also advised to obtain a blood pressure cuff and to
 monitor her blood pressures daily next week.  She
 understands that she should call should her blood pressures
 remain persistently above 140s over 90s.  The patient and
 her partner were both reassured that babies delivered at
 around 37 weeks generally will have a good outcome.
 At the end of the consultation, the patient and her partner
 stated that all their questions have been answered to their
 complete satisfaction.
 Thank you for referring this patient for a Maternal-Fetal
 Medicine consultation.
Recommendations

 Complete course of antenatal corticosteroids
 Outpatient management with twice weekly fetal testing if the
 patient's blood pressures remain stable and she is
 asymptomatic
 Delivery at 37 weeks (next week)
 Delivery prior to 37 weeks would be indicated should her
 blood pressures be persistently greater than 160 over 90s,
 should she complain of any signs or symptoms of severe
 preeclampsia, or for nonreassuring fetal status
